# Patient Record
Sex: Male | Born: 2000 | Race: Black or African American | Hispanic: No | Marital: Single | Smoking: Never smoker
Health system: Southern US, Community
[De-identification: ages and names within clinical notes are randomized; demographics above are authoritative.]

## PROBLEM LIST (undated history)

## (undated) DIAGNOSIS — J45909 Unspecified asthma, uncomplicated: Secondary | ICD-10-CM

## (undated) DIAGNOSIS — Z889 Allergy status to unspecified drugs, medicaments and biological substances status: Secondary | ICD-10-CM

---
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
## 2019-01-31 ENCOUNTER — Emergency Department (HOSPITAL_BASED_OUTPATIENT_CLINIC_OR_DEPARTMENT_OTHER)
Admission: EM | Admit: 2019-01-31 | Discharge: 2019-01-31 | Disposition: A | Discharge status: Home / Self Care | Payer: Medicaid Other | Attending: Emergency Medicine | Admitting: Emergency Medicine

## 2019-01-31 ENCOUNTER — Emergency Department (HOSPITAL_BASED_OUTPATIENT_CLINIC_OR_DEPARTMENT_OTHER): Payer: Medicaid Other

## 2019-01-31 ENCOUNTER — Other Ambulatory Visit: Payer: Self-pay

## 2019-01-31 ENCOUNTER — Encounter (HOSPITAL_BASED_OUTPATIENT_CLINIC_OR_DEPARTMENT_OTHER): Payer: Self-pay | Admitting: Emergency Medicine

## 2019-01-31 DIAGNOSIS — R0602 Shortness of breath: Secondary | ICD-10-CM | POA: Diagnosis not present

## 2019-01-31 DIAGNOSIS — H5789 Other specified disorders of eye and adnexa: Secondary | ICD-10-CM | POA: Insufficient documentation

## 2019-01-31 DIAGNOSIS — R410 Disorientation, unspecified: Secondary | ICD-10-CM | POA: Diagnosis not present

## 2019-01-31 DIAGNOSIS — R51 Headache: Secondary | ICD-10-CM | POA: Insufficient documentation

## 2019-01-31 DIAGNOSIS — J45909 Unspecified asthma, uncomplicated: Secondary | ICD-10-CM | POA: Diagnosis not present

## 2019-01-31 DIAGNOSIS — J3489 Other specified disorders of nose and nasal sinuses: Secondary | ICD-10-CM | POA: Diagnosis present

## 2019-01-31 LAB — CBC WITH DIFFERENTIAL/PLATELET
Abs Immature Granulocytes: 0.01 10*3/uL (ref 0.00–0.07)
Basophils Absolute: 0 10*3/uL (ref 0.0–0.1)
Basophils Relative: 0 %
Eosinophils Absolute: 0.5 10*3/uL (ref 0.0–0.5)
Eosinophils Relative: 11 %
HCT: 40.2 % (ref 39.0–52.0)
Hemoglobin: 12 g/dL — ABNORMAL LOW (ref 13.0–17.0)
Immature Granulocytes: 0 %
Lymphocytes Relative: 40 %
Lymphs Abs: 2 10*3/uL (ref 0.7–4.0)
MCH: 26.4 pg (ref 26.0–34.0)
MCHC: 29.9 g/dL — ABNORMAL LOW (ref 30.0–36.0)
MCV: 88.4 fL (ref 80.0–100.0)
Monocytes Absolute: 0.8 10*3/uL (ref 0.1–1.0)
Monocytes Relative: 17 %
Neutro Abs: 1.6 10*3/uL — ABNORMAL LOW (ref 1.7–7.7)
Neutrophils Relative %: 32 %
Platelets: 205 10*3/uL (ref 150–400)
RBC: 4.55 MIL/uL (ref 4.22–5.81)
RDW: 13 % (ref 11.5–15.5)
WBC: 5 10*3/uL (ref 4.0–10.5)
nRBC: 0 % (ref 0.0–0.2)

## 2019-01-31 LAB — URINALYSIS, ROUTINE W REFLEX MICROSCOPIC
Bilirubin Urine: NEGATIVE
Glucose, UA: NEGATIVE mg/dL
Hgb urine dipstick: NEGATIVE
Ketones, ur: NEGATIVE mg/dL
Leukocytes,Ua: NEGATIVE
Nitrite: NEGATIVE
Protein, ur: NEGATIVE mg/dL
Specific Gravity, Urine: 1.02 (ref 1.005–1.030)
pH: 6 (ref 5.0–8.0)

## 2019-01-31 LAB — COMPREHENSIVE METABOLIC PANEL
ALT: 12 U/L (ref 0–44)
AST: 21 U/L (ref 15–41)
Albumin: 4.5 g/dL (ref 3.5–5.0)
Alkaline Phosphatase: 31 U/L — ABNORMAL LOW (ref 38–126)
Anion gap: 8 (ref 5–15)
BUN: 12 mg/dL (ref 6–20)
CO2: 25 mmol/L (ref 22–32)
Calcium: 8.9 mg/dL (ref 8.9–10.3)
Chloride: 105 mmol/L (ref 98–111)
Creatinine, Ser: 0.92 mg/dL (ref 0.61–1.24)
GFR calc Af Amer: >60 mL/min (ref >60)
GFR calc non Af Amer: >60 mL/min (ref >60)
Glucose, Bld: 91 mg/dL (ref 70–99)
Potassium: 4.1 mmol/L (ref 3.5–5.1)
Sodium: 138 mmol/L (ref 135–145)
Total Bilirubin: 1 mg/dL (ref 0.3–1.2)
Total Protein: 7.6 g/dL (ref 6.5–8.1)

## 2019-01-31 NOTE — Discharge Instructions (Addendum)
You may purchase antihistamine such as Claritin, Zyrtec's to help with your runny nose in the morning along with the sinus pressure.  Your laboratory results were within normal limits.  Please follow-up with your primary care physician as needed.

## 2019-01-31 NOTE — ED Provider Notes (Signed)
[facility] EMERGENCY DEPARTMENT Provider Note   CSN: [id number] Arrival date & time: 01/31/19  1148    History   Chief Complaint Chief Complaint  Patient presents with  . Headache    HPI Tracy Mora is a 18 y.o. male.     18 y.o male with no PMH presents to the ED with a chief complaint of headache, shortness of breath and confusion since 2 days. Patient reports he was cleaning his floor with ammonia along with bleach and reports inhaling some of the fumes. He states after this episode he felt his eyes irritated, had a runny nose along with shortness of breath.  He reports noticing that his thought process is not as clear.  States he has a throbbing headache every morning on the frontal part of his head with no radiation.  Reports taking some Tylenol, Advil without improvement in symptoms.  States waking up for the past 2 days with a headache around the same area.   He denies any trauma, vision changes, slurred speech or dizziness.   The history is provided by the patient.  Headache  Associated symptoms: no abdominal pain, no back pain, no cough, no dizziness, no ear pain, no eye pain, no fever, no numbness, no seizures, no sore throat, no vomiting and no weakness     Past Medical History:  Diagnosis Date  . Asthma   . History of seasonal allergies     There are no active problems to display for this patient.   History reviewed. No pertinent surgical history.      Home Medications    Prior to Admission medications   Not on File    Family History No family history on file.  Social History Social History   Tobacco Use  . Smoking status: Never Smoker  . Smokeless tobacco: Never Used  Substance Use Topics  . Alcohol use: Yes    Comment: occ  . Drug use: No     Allergies   Patient has no known allergies.   Review of Systems Review of Systems  Constitutional: Negative for chills and fever.  HENT: Negative for ear pain and sore throat.    Eyes: Negative for pain and visual disturbance.  Respiratory: Positive for shortness of breath. Negative for cough and wheezing.   Cardiovascular: Negative for chest pain and palpitations.  Gastrointestinal: Negative for abdominal pain and vomiting.  Genitourinary: Negative for dysuria and hematuria.  Musculoskeletal: Negative for arthralgias and back pain.  Skin: Negative for color change and rash.  Neurological: Positive for headaches. Negative for dizziness, tremors, seizures, syncope, facial asymmetry, speech difficulty, weakness, light-headedness and numbness.  All other systems reviewed and are negative.    Physical Exam Updated Vital Signs BP 129/77 (BP Location: Right Arm)   Pulse 66   Temp 98.5 F (36.9 C) (Oral)   Resp 16   Ht  (1.803 m)   Wt 74.8 kg   SpO2 99%   BMI 23.01 kg/m   Physical Exam Vitals signs and nursing note reviewed.  Constitutional:      Appearance: He is well-developed.  HENT:     Head: Normocephalic and atraumatic.     Nose:     Right Sinus: Frontal sinus tenderness present. No maxillary sinus tenderness.     Left Sinus: Frontal sinus tenderness present. No maxillary sinus tenderness.  Eyes:     General: No scleral icterus.    Conjunctiva/sclera:     Right eye: Right conjunctiva is  not injected. No chemosis, exudate or hemorrhage.    Left eye: Left conjunctiva is not injected. No chemosis, exudate or hemorrhage.    Pupils: Pupils are equal, round, and reactive to light.     Comments: Pupils are equal and reactive.   Neck:     Musculoskeletal: Normal range of motion.  Cardiovascular:     Heart sounds: Normal heart sounds.  Pulmonary:     Effort: Pulmonary effort is normal.     Breath sounds: Normal breath sounds. No wheezing.     Comments: Lungs are cleared to auscultation. Chest:     Chest wall: No tenderness.  Abdominal:     General: Bowel sounds are normal. There is no distension.     Palpations: Abdomen is soft.      Tenderness: There is no abdominal tenderness.  Musculoskeletal:        General: No tenderness or deformity.  Skin:    General: Skin is warm and dry.  Neurological:     Mental Status: He is alert and oriented to person, place, and time.     Comments: Alert, oriented, thought content appropriate. Speech fluent without evidence of aphasia. Able to follow 2 step commands without difficulty.  Cranial Nerves:  II:  Peripheral visual fields grossly normal, pupils, round, reactive to light III,IV, VI: ptosis not present, extra-ocular motions intact bilaterally  V,VII: smile symmetric, facial light touch sensation equal VIII: hearing grossly normal bilaterally  IX,X: midline uvula rise  XI: bilateral shoulder shrug equal and strong XII: midline tongue extension  Motor:  5/5 in upper and lower extremities bilaterally including strong and equal grip strength and dorsiflexion/plantar flexion Sensory: light touch normal in all extremities.  Cerebellar: normal finger-to-nose with bilateral upper extremities, pronator drift negative Gait: normal gait and balance       ED Treatments / Results  Labs (all labs ordered are listed, but only abnormal results are displayed) Labs Reviewed  CBC WITH DIFFERENTIAL/PLATELET - Abnormal; Notable for the following components:      Result Value   Hemoglobin 12.0 (*)    MCHC 29.9 (*)    Neutro Abs 1.6 (*)    All other components within normal limits  COMPREHENSIVE METABOLIC PANEL - Abnormal; Notable for the following components:   Alkaline Phosphatase 31 (*)    All other components within normal limits  URINALYSIS, ROUTINE W REFLEX MICROSCOPIC    EKG None  Radiology Dg Chest 2 View  Result Date: 01/31/2019 CLINICAL DATA:  Headache, shortness of breath, and confusion. EXAM: CHEST - 2 VIEW COMPARISON:  None. FINDINGS: The heart size and mediastinal contours are within normal limits. Both lungs are clear. The visualized skeletal structures are  unremarkable. IMPRESSION: No active cardiopulmonary disease. Electronically Signed   By: Obie Dredge M.D.   On: 01/31/2019 13:10    Procedures Procedures (including critical care time)  Medications Ordered in ED Medications - No data to display   Initial Impression / Assessment and Plan / ED Course  I have reviewed the triage vital signs and the nursing notes.  Pertinent labs & imaging results that were available during my care of the patient were reviewed by me and considered in my medical decision making (see chart for details).    Patient with no past medical history presents to the ED with complaints of confusion, headache, shortness of breath.  Patient reports he was cleaning his kitchen floor about 2 days ago with ammonium bleach ever since his eyes have started burning along  with a headache, has taken Tylenol for his symptoms without relief.  Reports waking up every morning with a headache, runny nose.  He reports he feels like his train of thought is not as direct.  During evaluation patient is well-appearing, is alert oriented x3.  Neuro exam is unremarkable, does have some pain with palpation of the frontal sinuses.  No slurred speech, facial asymmetry noted during exam.  Will obtain screening labs.    CMP showed no electrolyte normality, glucose is normal at 91.  Creatinine level is normal, LFTs are unremarkable, denies any abdominal pain at this time.  CBC showed no leukocytosis, hemoglobin slightly decreased at 12.0 previous comparison is from 12 years ago.  Urinalysis showed no nitrites, leukocytes, white blood cell count.  Patient's vital signs stable while in the ED, he satting at 99% on room air, no respiratory distress, no wheezing, cough but will obtain a chest x-ray as patient does have a previous history of asthma. DG chest showed:  No consolidation, pneumothorax, pleural effusion.  These results were discussed with patient at length, patient currently on face time with  family member, I advised him he could likely purchase some Claritin or Zyrtec's to help with his rhinorrhea along with sinus pressure in the morning.  Patient is neurologically intact, with stable vital signs satting at 99% on room air without any labored effort.  At this time will have him follow-up with PCP as needed.  Return precautions provided at length.  Portions of this note were generated with Scientist, clinical (histocompatibility and immunogenetics)Dragon dictation software. Dictation errors may occur despite best attempts at proofreading.    Final Clinical Impressions(s) / ED Diagnoses   Final diagnoses:  Sinus pressure    ED Discharge Orders    None       Claude MangesSoto, Sehaj Kolden, PA-C 01/31/19 1334    Terrilee FilesButler, Michael C, MD 01/31/19 [phone]

## 2019-01-31 NOTE — ED Notes (Signed)
ED Provider at bedside. 

## 2019-01-31 NOTE — ED Triage Notes (Signed)
Pt cleaned his kitchen floor 2 days ago with Ammonia and Bleach.  His eyes started burning and he got a HA.  Has had the HA intermittently ever since.  He went to work afterward but sts his thinking was not normal.  He looses his train of thought.  Sts something is not right. This situation continues.

## 2019-02-01 ENCOUNTER — Other Ambulatory Visit: Payer: Self-pay

## 2019-02-01 ENCOUNTER — Emergency Department (HOSPITAL_BASED_OUTPATIENT_CLINIC_OR_DEPARTMENT_OTHER)
Admission: EM | Admit: 2019-02-01 | Discharge: 2019-02-02 | Disposition: A | Discharge status: Home / Self Care | Payer: Medicaid Other | Attending: Emergency Medicine | Admitting: Emergency Medicine

## 2019-02-01 ENCOUNTER — Encounter (HOSPITAL_BASED_OUTPATIENT_CLINIC_OR_DEPARTMENT_OTHER): Payer: Self-pay | Admitting: Emergency Medicine

## 2019-02-01 ENCOUNTER — Emergency Department (HOSPITAL_BASED_OUTPATIENT_CLINIC_OR_DEPARTMENT_OTHER): Payer: Medicaid Other

## 2019-02-01 DIAGNOSIS — J45909 Unspecified asthma, uncomplicated: Secondary | ICD-10-CM | POA: Diagnosis not present

## 2019-02-01 DIAGNOSIS — R51 Headache: Secondary | ICD-10-CM | POA: Insufficient documentation

## 2019-02-01 DIAGNOSIS — R519 Headache, unspecified: Secondary | ICD-10-CM

## 2019-02-01 MED ORDER — DIPHENHYDRAMINE HCL 50 MG/ML IJ SOLN
25.0000 mg | Freq: Once | INTRAMUSCULAR | Status: AC
  Administered 2019-02-01: 23:00:00 25 mg via INTRAVENOUS
  Filled 2019-02-01: qty 1

## 2019-02-01 MED ORDER — SODIUM CHLORIDE 0.9 % IV BOLUS
1000.0000 mL | Freq: Once | INTRAVENOUS | Status: AC
  Administered 2019-02-01: 23:00:00 1000 mL via INTRAVENOUS

## 2019-02-01 MED ORDER — METOCLOPRAMIDE HCL 5 MG/ML IJ SOLN
10.0000 mg | Freq: Once | INTRAMUSCULAR | Status: AC
  Administered 2019-02-01: 23:00:00 10 mg via INTRAVENOUS
  Filled 2019-02-01: qty 2

## 2019-02-01 MED ORDER — ONDANSETRON HCL 4 MG/2ML IJ SOLN
4.0000 mg | Freq: Once | INTRAMUSCULAR | Status: AC
  Administered 2019-02-01: 23:00:00 4 mg via INTRAVENOUS
  Filled 2019-02-01: qty 2

## 2019-02-01 MED ORDER — KETOROLAC TROMETHAMINE 30 MG/ML IJ SOLN
30.0000 mg | Freq: Once | INTRAMUSCULAR | Status: AC
  Administered 2019-02-01: 30 mg via INTRAVENOUS
  Filled 2019-02-01: qty 1

## 2019-02-01 NOTE — ED Triage Notes (Signed)
States took 2 Excedrin today at 1700 then took a nap.

## 2019-02-01 NOTE — ED Triage Notes (Signed)
Patient presents with complaints of headache; states pain behind left eye and slightly behind right eye with intermittent nausea and dizziness; denies vomiting. Seen here yesterday for same; states confusion has resolved since yesterday. Alert and oriented with steady gait.

## 2019-02-02 NOTE — Discharge Instructions (Signed)
Follow-up with [facility] to establish a primary care doctor if you do not have one.   Return emergency department for any worsening headache, fever, difficulty walking, numbness/weakness in arms or legs or any other worsening or concerning symptoms.

## 2019-02-02 NOTE — ED Notes (Signed)
Pt passed PO challenge.

## 2019-02-02 NOTE — ED Provider Notes (Signed)
[facility] EMERGENCY DEPARTMENT Provider Note   CSN: [id number] Arrival date & time: 02/01/19  2122    History   Chief Complaint Chief Complaint  Patient presents with  . Headache    HPI Tracy Mora is a 18 y.o. male who presents for evaluation of headache that began today.  Patient reports that he started developing headache earlier this morning.  He states that headache began gradual in nature and progressively worsened.  No thunderclap headache.  No preceding trauma, injury, fall.  He denies any alleviating or aggravating factors.  He reports some associated photophobia as well as nausea, dizziness.  He has not had any vomiting.  Patient states he does not have any history of migraines.  He was seen here in the emergency department yesterday for evaluation for similar symptoms as well as some concern for inhaled ammonia and bleach.  Patient reports that he felt better at that time but then states today, he had repeat headache, prompting ED visit.  He did take Excedrin at 5:00 but states that did not help with his pain.  Patient denies any fever, chest pain, difficulty breathing, changes, numbness/weakness of his arms or legs, difficulty ambulating.     The history is provided by the patient.    Past Medical History:  Diagnosis Date  . Asthma   . History of seasonal allergies     There are no active problems to display for this patient.   History reviewed. No pertinent surgical history.      Home Medications    Prior to Admission medications   Not on File    Family History History reviewed. No pertinent family history.  Social History Social History   Tobacco Use  . Smoking status: Never Smoker  . Smokeless tobacco: Never Used  Substance Use Topics  . Alcohol use: Yes    Comment: occ  . Drug use: No     Allergies   Patient has no known allergies.   Review of Systems Review of Systems  Constitutional: Negative for fever.  Eyes: Positive  for photophobia. Negative for visual disturbance.  Respiratory: Negative for cough and shortness of breath.   Cardiovascular: Negative for chest pain.  Gastrointestinal: Positive for nausea. Negative for abdominal pain and vomiting.  Genitourinary: Negative for dysuria and hematuria.  Neurological: Positive for headaches. Negative for weakness and numbness.  All other systems reviewed and are negative.    Physical Exam Updated Vital Signs BP 117/63   Pulse 69   Temp 98.5 F (36.9 C) (Oral)   Resp 16   Ht 5\' 11"  (1.803 m)   Wt 74.8 kg   SpO2 100%   BMI 23.01 kg/m   Physical Exam Vitals signs and nursing note reviewed.  Constitutional:      Appearance: Normal appearance. He is well-developed.  HENT:     Head: Normocephalic and atraumatic.  Eyes:     General: Lids are normal.     Conjunctiva/sclera: Conjunctivae normal.     Pupils: Pupils are equal, round, and reactive to light.     Comments: PERRL. EOMS intact without any difficulty.   Neck:     Musculoskeletal: Full passive range of motion without pain and neck supple.     Comments: Full flexion/extension and lateral movement of neck fully intact. No bony midline tenderness. No deformities or crepitus.  No rigidity.  Neck is supple. Cardiovascular:     Rate and Rhythm: Normal rate and regular rhythm.  Pulses: Normal pulses.     Heart sounds: Normal heart sounds. No murmur. No friction rub. No gallop.   Pulmonary:     Effort: Pulmonary effort is normal.     Breath sounds: Normal breath sounds.  Abdominal:     Palpations: Abdomen is soft. Abdomen is not rigid.     Tenderness: There is no abdominal tenderness. There is no guarding.  Musculoskeletal: Normal range of motion.  Skin:    General: Skin is warm and dry.     Capillary Refill: Capillary refill takes less than 2 seconds.  Neurological:     Mental Status: He is alert and oriented to person, place, and time.     Comments: Cranial nerves III-XII intact Follows  commands, Moves all extremities  5/5 strength to BUE and BLE  Sensation intact throughout all major nerve distributions Normal coordination No pronator drift. No gait abnormalities  No slurred speech. No facial droop.   Psychiatric:        Speech: Speech normal.      ED Treatments / Results  Labs (all labs ordered are listed, but only abnormal results are displayed) Labs Reviewed - No data to display  EKG None  Radiology Ct Head Wo Contrast  Result Date: 02/01/2019 CLINICAL DATA:  Headache for 2 days since using cleaning products. Initial encounter. EXAM: CT HEAD WITHOUT CONTRAST TECHNIQUE: Contiguous axial images were obtained from the base of the skull through the vertex without intravenous contrast. COMPARISON:  None. FINDINGS: Brain: No evidence of acute infarction, hemorrhage, hydrocephalus, extra-axial collection or mass lesion/mass effect. Vascular: No hyperdense vessel or unexpected calcification. Skull: Normal. Negative for fracture or focal lesion. Sinuses/Orbits: Negative. Other: None. IMPRESSION: Normal head CT. Electronically Signed   By: Drusilla Kanner M.D.   On: 02/01/2019 23:22    Procedures Procedures (including critical care time)  Medications Ordered in ED Medications  ondansetron (ZOFRAN) injection 4 mg (4 mg Intravenous Given 02/01/19 2242)  metoCLOPramide (REGLAN) injection 10 mg (10 mg Intravenous Given 02/01/19 2243)  sodium chloride 0.9 % bolus 1,000 mL ( Intravenous Stopped 02/01/19 2343)  diphenhydrAMINE (BENADRYL) injection 25 mg (25 mg Intravenous Given 02/01/19 2241)  ketorolac (TORADOL) 30 MG/ML injection 30 mg (30 mg Intravenous Given 02/01/19 2333)     Initial Impression / Assessment and Plan / ED Course  I have reviewed the triage vital signs and the nursing notes.  Pertinent labs & imaging results that were available during my care of the patient were reviewed by me and considered in my medical decision making (see chart for details).         18 year old male who presents for evaluation of headache that began yesterday.  States he was seen in the emergency department yesterday felt better and reports headache returned today.  No preceding trauma, injury, fall.  He states he is concerned because he was cleaning with ammonia and bleach.  Reports some associated photophobia, nausea, dizziness.  No fevers, vision changes, numbness/weakness of arms or legs. Patient is afebrile, non-toxic appearing, sitting comfortably on examination table. Vital signs reviewed and stable. No neuro deficits noted on exam.  Consider migraine headache versus sinusitis. Given that he was here yesterday, will review his visit.  History/physical exam not concerning for meningitis, dural venous thrombosis.  Review of visit yesterday show no evidence of head CT.  Given that his second visit, will plan for head CT for evaluation of any intracranial abnormality.  ED had reviewed.  No evidence of intracranial abnormality.  Reevaluation  after migraine cocktail.  Patient reports improvement in headache.  We will plan to p.o. challenge.  Patient able tolerate p.o. without difficulty.  Vital signs are stable.  He is able to ambulate in the part without any abnormalities.  Patient reports improvement in his headache. At this time, patient exhibits no emergent life-threatening condition that require further evaluation in ED or admission. Patient had ample opportunity for questions and discussion. All patient's questions were answered with full understanding. Strict return precautions discussed. Patient expresses understanding and agreement to plan.   Portions of this note were generated with Scientist, clinical (histocompatibility and immunogenetics). Dictation errors may occur despite best attempts at proofreading.   Final Clinical Impressions(s) / ED Diagnoses   Final diagnoses:  Generalized headache    ED Discharge Orders    None       Rosana Hoes 02/02/19 2312    Little,  Ambrose Finland, MD 02/03/19 [phone]

## 2019-02-02 NOTE — ED Notes (Signed)
Pt understood dc material. NAD noted. All questions answered to satisfaction. Pt escorted to check out counter. 

## 2019-02-02 NOTE — ED Notes (Signed)
ED Provider at bedside. 

## 2019-02-13 NOTE — Progress Notes (Signed)
Patient ID: Tracy Mora, male   DOB: 2001/03/07, 18 y.o.   MRN: [id number]   Virtual Visit via Telephone Note  I connected with Tracy Mora on 02/14/19 at  1:30 PM EDT by telephone and verified that I am speaking with the correct person using two identifiers.   I discussed the limitations, risks, security and privacy concerns of performing an evaluation and management service by telephone and the availability of in person appointments. I also discussed with the patient that there may be a patient responsible charge related to this service. The patient expressed understanding and agreed to proceed.   Patient location:  home My Location:  [facility] office Persons on the call:  The patient and myself  History of Present Illness:  After being seen in the ED 02/01/2019 for HA after mixing bleach and ammonia.  Head CT was negative.  No HA since.  HA have gone away.    C/o infected razor bump in his genital region.  Just shaved yesterday and now looks like a pimple.  No fever.    Tracy Mora is a 18 y.o. male who presents for evaluation of headache that began today.  Patient reports that he started developing headache earlier this morning.  He states that headache began gradual in nature and progressively worsened.  No thunderclap headache.  No preceding trauma, injury, fall.  He denies any alleviating or aggravating factors.  He reports some associated photophobia as well as nausea, dizziness.  He has not had any vomiting.  Patient states he does not have any history of migraines.  He was seen here in the emergency department yesterday for evaluation for similar symptoms as well as some concern for inhaled ammonia and bleach.  Patient reports that he felt better at that time but then states today, he had repeat headache, prompting ED visit.  He did take Excedrin at 5:00 but states that did not help with his pain.  Patient denies any fever, chest pain, difficulty breathing, changes, numbness/weakness  of his arms or legs, difficulty ambulating.  From ED 02/01/2019 ED had reviewed.  No evidence of intracranial abnormality.  Reevaluation after migraine cocktail.  Patient reports improvement in headache.  We will plan to p.o. challenge.  Patient able tolerate p.o. without difficulty.  Vital signs are stable.  He is able to ambulate in the part without any abnormalities.  Patient reports improvement in his headache. At this time, patient exhibits no emergent life-threatening condition that require further evaluation in ED or admission.   Observations/Objective: A&Ox3 TP linear.  Speech is clear   Assessment and Plan: 1. Acute nonintractable headache, unspecified headache type resolved  2. Folliculitis - mupirocin ointment (BACTROBAN) 2 %; Apply small amount bid to AA  Dispense: 22 g; Refill: 0  3. Encounter for examination following treatment at hospital Resolved/no further episode    Follow Up Instructions: 2-3 months to assign pcp   I discussed the assessment and treatment plan with the patient. The patient was provided an opportunity to ask questions and all were answered. The patient agreed with the plan and demonstrated an understanding of the instructions.   The patient was advised to call back or seek an in-person evaluation if the symptoms worsen or if the condition fails to improve as anticipated.  I provided 8 minutes of non-face-to-face time during this encounter.   Georgian Co, PA-C

## 2019-02-14 ENCOUNTER — Other Ambulatory Visit: Payer: Self-pay

## 2019-02-14 ENCOUNTER — Ambulatory Visit: Payer: Medicaid Other | Attending: Family Medicine | Admitting: Physician Assistant

## 2019-02-14 DIAGNOSIS — L739 Follicular disorder, unspecified: Secondary | ICD-10-CM

## 2019-02-14 DIAGNOSIS — R51 Headache: Secondary | ICD-10-CM

## 2019-02-14 DIAGNOSIS — Z09 Encounter for follow-up examination after completed treatment for conditions other than malignant neoplasm: Secondary | ICD-10-CM

## 2019-02-14 DIAGNOSIS — R519 Headache, unspecified: Secondary | ICD-10-CM

## 2019-02-14 MED ORDER — MUPIROCIN 2 % EX OINT: TOPICAL_OINTMENT | CUTANEOUS | 0 refills | Status: DC

## 2019-02-14 NOTE — Progress Notes (Signed)
Pt. Stated his headache got much better and think it may be due to stress.

## 2019-05-09 ENCOUNTER — Emergency Department (HOSPITAL_BASED_OUTPATIENT_CLINIC_OR_DEPARTMENT_OTHER)
Admission: EM | Admit: 2019-05-09 | Discharge: 2019-05-09 | Disposition: A | Discharge status: Home / Self Care | Payer: Medicaid Other | Attending: Emergency Medicine | Admitting: Emergency Medicine

## 2019-05-09 ENCOUNTER — Other Ambulatory Visit: Payer: Self-pay

## 2019-05-09 ENCOUNTER — Encounter (HOSPITAL_BASED_OUTPATIENT_CLINIC_OR_DEPARTMENT_OTHER): Payer: Self-pay | Admitting: *Deleted

## 2019-05-09 DIAGNOSIS — R220 Localized swelling, mass and lump, head: Secondary | ICD-10-CM | POA: Diagnosis not present

## 2019-05-09 DIAGNOSIS — K130 Diseases of lips: Secondary | ICD-10-CM | POA: Diagnosis present

## 2019-05-09 DIAGNOSIS — Q380 Congenital malformations of lips, not elsewhere classified: Secondary | ICD-10-CM

## 2019-05-09 NOTE — ED Provider Notes (Signed)
[facility] Emergency Department Provider Note MRN:  [id number]  Arrival date & time: 05/09/19     Chief Complaint   Oral Swelling   History of Present Illness   Tracy Mora is a 17 y.o. year-old male with a history of asthma presenting to the ED with chief complaint of nodule.  Patient woke up with a small nodule to the middle part of his upper lip.  Denies any other complaints, no shortness of breath, no sore throat, no fever, no abdominal pain, no allergies, no new detergents or exposures, no bug bites.  Review of Systems  A problem-focused ROS was performed. Positive for nodule.  Patient denies fever, shortness of breath. Patient's Health History    Past Medical History:  Diagnosis Date  . Asthma   . History of seasonal allergies     History reviewed. No pertinent surgical history.  History reviewed. No pertinent family history.  Social History   Socioeconomic History  . Marital status: Single    Spouse name: Not on file  . Number of children: Not on file  . Years of education: Not on file  . Highest education level: Not on file  Occupational History  . Not on file  Social Needs  . Financial resource strain: Not on file  . Food insecurity    Worry: Not on file    Inability: Not on file  . Transportation needs    Medical: Not on file    Non-medical: Not on file  Tobacco Use  . Smoking status: Never Smoker  . Smokeless tobacco: Never Used  Substance and Sexual Activity  . Alcohol use: Yes    Comment: occ  . Drug use: No  . Sexual activity: Yes    Birth control/protection: None  Lifestyle  . Physical activity    Days per week: Not on file    Minutes per session: Not on file  . Stress: Not on file  Relationships  . Social Herbalist on phone: Not on file    Gets together: Not on file    Attends religious service: Not on file    Active member of club or organization: Not on file    Attends meetings of clubs or  organizations: Not on file    Relationship status: Not on file  . Intimate partner violence    Fear of current or ex partner: Not on file    Emotionally abused: Not on file    Physically abused: Not on file    Forced sexual activity: Not on file  Other Topics Concern  . Not on file  Social History Narrative  . Not on file     Physical Exam  Vital Signs and Nursing Notes reviewed Vitals:   05/09/19 1138  BP: 120/67  Pulse: 64  Resp: 12  Temp: 98.3 F (36.8 C)  SpO2: 100%    CONSTITUTIONAL: Well-appearing, NAD NEURO:  Alert and oriented x 3, no focal deficits EYES:  eyes equal and reactive ENT/NECK:  no LAD, no JVD CARDIO: Regular rate, well-perfused, normal S1 and S2 PULM:  CTAB no wheezing or rhonchi GI/GU:  normal bowel sounds, non-distended, non-tender MSK/SPINE:  No gross deformities, no edema SKIN:  no rash; there is a 0.5 cm soft nodule to the medial aspect of the upper lip below the vermilion border [facility]:  Appropriate speech and behavior  Diagnostic and Interventional Summary    Labs Reviewed - No data to display  No  orders to display    Medications - No data to display   Procedures Critical Care  ED Course and Medical Decision Making  I have reviewed the triage vital signs and the nursing notes.  Pertinent labs & imaging results that were available during my care of the patient were reviewed by me and considered in my medical decision making (see below for details).  Nodule to the upper lip, no signs of infection, no other symptoms to suggest significant allergic reaction, normal vital signs, no increased work of breathing, considering bug bite, advised Benadryl at home, advise dermatology follow-up if not improved.  Tracy SowMichael M. Pilar PlateBero, MD [email or web address] [email or web address]  Final Clinical Impressions(s) / ED Diagnoses     ICD-10-CM   1. Median nodule of upper lip  Q38.0     ED Discharge Orders    None         Discharge Instructions     You were evaluated in the Emergency Department and after careful evaluation, we did not find any emergent condition requiring admission or further testing in the hospital.  We recommend Benadryl cream for the next few days.  If not improved or resolved within a week, we recommend follow-up with a dermatologist.  Please return to the Emergency Department if you experience any worsening of your condition.  We encourage you to follow up with a primary care provider.  Thank you for allowing us to be a part of your care.      Sabas SousBero, Tracy M, MD 05/09/19 1316

## 2019-05-09 NOTE — Discharge Instructions (Signed)
You were evaluated in the Emergency Department and after careful evaluation, we did not find any emergent condition requiring admission or further testing in the hospital.  We recommend Benadryl cream for the next few days.  If not improved or resolved within a week, we recommend follow-up with a dermatologist.  Please return to the Emergency Department if you experience any worsening of your condition.  We encourage you to follow up with a primary care provider.  Thank you for allowing [place] to be a part of your care.

## 2019-05-09 NOTE — ED Triage Notes (Signed)
Pt reports noticing swelling to the cupids bow area of his upper lip this am. No redness, tenderness, denies pain or itching, denies any injury or trauma.

## 2019-06-06 ENCOUNTER — Ambulatory Visit: Payer: Medicaid Other | Admitting: Family Medicine

## 2019-07-08 ENCOUNTER — Telehealth: Payer: Medicaid Other

## 2019-07-10 ENCOUNTER — Ambulatory Visit: Payer: Medicaid Other | Attending: Family Medicine | Admitting: Physician Assistant

## 2019-07-10 ENCOUNTER — Other Ambulatory Visit: Payer: Self-pay

## 2019-07-10 DIAGNOSIS — R519 Headache, unspecified: Secondary | ICD-10-CM

## 2019-07-10 DIAGNOSIS — R509 Fever, unspecified: Secondary | ICD-10-CM

## 2019-07-10 DIAGNOSIS — L738 Other specified follicular disorders: Secondary | ICD-10-CM | POA: Diagnosis not present

## 2019-07-10 DIAGNOSIS — L739 Follicular disorder, unspecified: Secondary | ICD-10-CM

## 2019-07-10 MED ORDER — MUPIROCIN 2 % EX OINT: TOPICAL_OINTMENT | CUTANEOUS | 0 refills | Status: DC

## 2019-07-10 NOTE — Progress Notes (Signed)
Patient ID: Tracy Mora, male   DOB: 09-16-2001, 18 y.o.   MRN: [id number] Virtual Visit via Telephone Note  I connected with Tracy Mora on 07/10/19 at 10:10 AM EDT by telephone and verified that I am speaking with the correct person using two identifiers.   I discussed the limitations, risks, security and privacy concerns of performing an evaluation and management service by telephone and the availability of in person appointments. I also discussed with the patient that there may be a patient responsible charge related to this service. The patient expressed understanding and agreed to proceed.  Patient location:  home My Location:  home office Persons on the call:  Me and the patient.   History of Present Illness:  Patient c/o 1 week h/o HA and fatigue. HA is all over.  No vision changes. Unsure if he has been exposed to coronavirus.  Last night he had a temp of 100. No N/V/D.  No cough or SOB.  Also trouble sleeping for about 1 week-wakes up 3-4 times a night.  excedrin helped his HA.  + chills.    Needs RF on bactroban for occasional folliculitis.     Observations/Objective:  NAD.  Speech is clear.  A&Ox3   Assessment and Plan: 1. Folliculitis - mupirocin ointment (BACTROBAN) 2 %; Apply small amount bid to AA  Dispense: 22 g; Refill: 0  2. Nonintractable headache, unspecified chronicity pattern, unspecified headache type Tylenol or advil - Novel Coronavirus, NAA ([facility]); Future  3. Fever, unspecified fever cause Covid precautions discussed - Novel Coronavirus, NAA ([facility]); Future   Follow Up Instructions: 4-6 weeks to be assigned PCP   I discussed the assessment and treatment plan with the patient. The patient was provided an opportunity to ask questions and all were answered. The patient agreed with the plan and demonstrated an understanding of the instructions.   The patient was advised to call back or seek an in-person evaluation if the symptoms worsen or if the  condition fails to improve as anticipated.  I provided 11 minutes of non-face-to-face time during this encounter.   Freeman Caldron, PA-C

## 2019-07-10 NOTE — Progress Notes (Signed)
Med refill  Fatigue for about 1 wk now  Headache for about 1 wk but have a history of Migraines that started April but went away on it's own.   Fever last night at 100.00 but this morning at 98  Per pt when it comes to his chest pain, he have a history of anxiety and when the pain does come its off and on and not consistent.

## 2019-07-11 ENCOUNTER — Emergency Department (HOSPITAL_BASED_OUTPATIENT_CLINIC_OR_DEPARTMENT_OTHER)
Admission: EM | Admit: 2019-07-11 | Discharge: 2019-07-11 | Disposition: A | Discharge status: Home / Self Care | Payer: Medicaid Other | Attending: Emergency Medicine | Admitting: Emergency Medicine

## 2019-07-11 ENCOUNTER — Emergency Department (HOSPITAL_BASED_OUTPATIENT_CLINIC_OR_DEPARTMENT_OTHER): Payer: Medicaid Other

## 2019-07-11 ENCOUNTER — Other Ambulatory Visit: Payer: Self-pay

## 2019-07-11 ENCOUNTER — Encounter (HOSPITAL_BASED_OUTPATIENT_CLINIC_OR_DEPARTMENT_OTHER): Payer: Self-pay | Admitting: *Deleted

## 2019-07-11 DIAGNOSIS — J45909 Unspecified asthma, uncomplicated: Secondary | ICD-10-CM | POA: Insufficient documentation

## 2019-07-11 DIAGNOSIS — R0789 Other chest pain: Secondary | ICD-10-CM | POA: Diagnosis not present

## 2019-07-11 DIAGNOSIS — R071 Chest pain on breathing: Secondary | ICD-10-CM | POA: Insufficient documentation

## 2019-07-11 DIAGNOSIS — R079 Chest pain, unspecified: Secondary | ICD-10-CM | POA: Diagnosis present

## 2019-07-11 MED ORDER — CYCLOBENZAPRINE HCL 10 MG PO TABS: 10.0000 mg | ORAL_TABLET | Freq: Three times a day (TID) | ORAL | 0 refills | Status: DC | PRN

## 2019-07-11 MED ORDER — IBUPROFEN 800 MG PO TABS: 800.0000 mg | ORAL_TABLET | Freq: Three times a day (TID) | ORAL | 0 refills | Status: DC | PRN

## 2019-07-11 NOTE — ED Notes (Signed)
ED Provider at bedside. 

## 2019-07-11 NOTE — ED Triage Notes (Addendum)
C/o left sided chest pain and left arm numbness  " only at night "  Increased pain with movt x 5 days

## 2019-07-11 NOTE — Discharge Instructions (Addendum)
Return here as needed. Follow up with a primary doctor. °

## 2019-07-11 NOTE — ED Notes (Signed)
Family at bedside. 

## 2019-07-15 NOTE — ED Provider Notes (Signed)
[facility] EMERGENCY DEPARTMENT Provider Note   CSN: [id number] Arrival date & time: 07/11/19  1853     History   Chief Complaint Chief Complaint  Patient presents with  . Chest Pain    HPI Tracy Mora is a 18 y.o. male.     HPI Patient presents to the emergency department with left-sided chest pain only at night.  The patient states the pain increases with movement.  Patient states has been ongoing for the last 5 days.  Patient states that certain movements and palpation make the pain worse.  Patient states deep breathing also makes the pain worse.  Patient denies any history of early onset cardiac disease in his family.  The patient denies shortness of breath, headache,blurred vision, neck pain, fever, cough, weakness, numbness, dizziness, anorexia, edema, abdominal pain, nausea, vomiting, diarrhea, rash, back pain, dysuria, hematemesis, bloody stool, near syncope, or syncope. Past Medical History:  Diagnosis Date  . Asthma   . History of seasonal allergies     There are no active problems to display for this patient.   History reviewed. No pertinent surgical history.      Home Medications    Prior to Admission medications   Medication Sig Start Date End Date Taking? Authorizing Provider  cyclobenzaprine (FLEXERIL) 10 MG tablet Take 1 tablet (10 mg total) by mouth 3 (three) times daily as needed for muscle spasms. 07/11/19   Mikaele Stecher, Harrell Gave, PA-C  ibuprofen (ADVIL) 800 MG tablet Take 1 tablet (800 mg total) by mouth every 8 (eight) hours as needed. 07/11/19   Dewanda Fennema, Harrell Gave, PA-C  mupirocin ointment (BACTROBAN) 2 % Apply small amount bid to AA 07/10/19   Argentina Donovan, PA-C    Family History No family history on file.  Social History Social History   Tobacco Use  . Smoking status: Never Smoker  . Smokeless tobacco: Never Used  Substance Use Topics  . Alcohol use: Yes    Comment: occ  . Drug use: No     Allergies   Patient has  no known allergies.   Review of Systems Review of Systems All other systems negative except as documented in the HPI. All pertinent positives and negatives as reviewed in the HPI.  Physical Exam Updated Vital Signs BP 111/64 (BP Location: Right Arm)   Pulse 70   Temp 98.4 F (36.9 C)   Resp 18   Ht 5\' 11"  (1.803 m)   Wt 77.1 kg   SpO2 97%   BMI 23.71 kg/m   Physical Exam Vitals signs and nursing note reviewed.  Constitutional:      General: He is not in acute distress.    Appearance: He is well-developed.  HENT:     Head: Normocephalic and atraumatic.  Eyes:     Pupils: Pupils are equal, round, and reactive to light.  Neck:     Musculoskeletal: Normal range of motion and neck supple.  Cardiovascular:     Rate and Rhythm: Normal rate and regular rhythm.     Heart sounds: Normal heart sounds. No murmur. No friction rub. No gallop.   Pulmonary:     Effort: Pulmonary effort is normal. No respiratory distress.     Breath sounds: Normal breath sounds. No decreased breath sounds or wheezing.  Abdominal:     General: Bowel sounds are normal. There is no distension.     Palpations: Abdomen is soft.     Tenderness: There is no abdominal tenderness.  Skin:  General: Skin is warm and dry.     Capillary Refill: Capillary refill takes less than 2 seconds.     Findings: No erythema or rash.  Neurological:     Mental Status: He is alert and oriented to person, place, and time.     Motor: No abnormal muscle tone.     Coordination: Coordination normal.  Psychiatric:        Behavior: Behavior normal.      ED Treatments / Results  Labs (all labs ordered are listed, but only abnormal results are displayed) Labs Reviewed - No data to display  EKG EKG Interpretation  Date/Time:  Thursday July 11 2019 18:59:39 EDT Ventricular Rate:  88 PR Interval:  140 QRS Duration: 92 QT Interval:  318 QTC Calculation: 384 R Axis:   41 Text Interpretation:  Normal sinus rhythm  Normal ECG Confirmed by Raeford Razor [phone]) on 07/11/2019 8:16:27 PM   Radiology No results found.  Procedures Procedures (including critical care time)  Medications Ordered in ED Medications - No data to display   Initial Impression / Assessment and Plan / ED Course  I have reviewed the triage vital signs and the nursing notes.  Pertinent labs & imaging results that were available during my care of the patient were reviewed by me and considered in my medical decision making (see chart for details).       Patient will be discharged with diagnosis of chest wall pain.  Patient states that he has no questions and agrees to the plan.  I have advised patient return here for any worsening in his condition.  The EKG did not show any acute changes and his chest x-ray does not show any abnormalities.  His vital signs remained stable here in the emergency department.  Final Clinical Impressions(s) / ED Diagnoses   Final diagnoses:  Chest wall pain    ED Discharge Orders         Ordered    ibuprofen (ADVIL) 800 MG tablet  Every 8 hours PRN     07/11/19 2105    cyclobenzaprine (FLEXERIL) 10 MG tablet  3 times daily PRN     07/11/19 2105           Charlestine Night, PA-C 07/15/19 Ronnette Hila, MD 07/15/19 1040

## 2019-12-20 ENCOUNTER — Encounter (HOSPITAL_BASED_OUTPATIENT_CLINIC_OR_DEPARTMENT_OTHER): Payer: Self-pay | Admitting: *Deleted

## 2019-12-20 ENCOUNTER — Emergency Department (HOSPITAL_BASED_OUTPATIENT_CLINIC_OR_DEPARTMENT_OTHER): Payer: Medicaid Other

## 2019-12-20 ENCOUNTER — Emergency Department (HOSPITAL_BASED_OUTPATIENT_CLINIC_OR_DEPARTMENT_OTHER)
Admission: EM | Admit: 2019-12-20 | Discharge: 2019-12-20 | Disposition: A | Discharge status: Home / Self Care | Payer: Medicaid Other | Attending: Emergency Medicine | Admitting: Emergency Medicine

## 2019-12-20 ENCOUNTER — Other Ambulatory Visit: Payer: Self-pay

## 2019-12-20 DIAGNOSIS — R0789 Other chest pain: Secondary | ICD-10-CM | POA: Insufficient documentation

## 2019-12-20 DIAGNOSIS — J45909 Unspecified asthma, uncomplicated: Secondary | ICD-10-CM | POA: Insufficient documentation

## 2019-12-20 DIAGNOSIS — R531 Weakness: Secondary | ICD-10-CM | POA: Insufficient documentation

## 2019-12-20 DIAGNOSIS — R1310 Dysphagia, unspecified: Secondary | ICD-10-CM | POA: Diagnosis not present

## 2019-12-20 MED ORDER — MELOXICAM 15 MG PO TABS: ORAL_TABLET | ORAL | 0 refills | Status: AC

## 2019-12-20 MED ORDER — PANTOPRAZOLE SODIUM 40 MG PO TBEC
40.0000 mg | DELAYED_RELEASE_TABLET | Freq: Once | ORAL | Status: AC
  Administered 2019-12-20: 40 mg via ORAL
  Filled 2019-12-20: qty 1

## 2019-12-20 MED ORDER — ESOMEPRAZOLE MAGNESIUM 40 MG PO CPDR: 40.0000 mg | DELAYED_RELEASE_CAPSULE | Freq: Every day | ORAL | 0 refills | Status: AC

## 2019-12-20 NOTE — ED Triage Notes (Signed)
States he was [place] the beach last [place] and was playing roughly with his brother. He feels like he pulled a muscle in his left chest. He woke this am with weakness and pain in his left arm.

## 2019-12-20 NOTE — ED Provider Notes (Signed)
[place] DEPT [facility] Provider Note: Georgena Spurling, MD, FACEP  CSN: [id number] MRN: [id number] ARRIVAL: 12/20/19 at 2216 ROOM: [place]  Chest Pain   HISTORY OF PRESENT ILLNESS  12/20/19 11:17 PM Tracy Mora is a 19 y.o. male who was playing roughly with his brother several days ago and feels like he pulled a muscle in his left chest.  Specifically he has been having pain in his left chest about the anterior axillary line inferior to the axilla.  He rates his pain as a 5 out of 10.  It only occurs with certain positions.  Notably it occurs when he turns his head to the left.  It is not worse with breathing nor is he short of breath.  He has taken ibuprofen without relief.  He states when he wakes up in the morning he sometimes has a sensation of weakness in his left arm.  He also has been having some discomfort when swallowing food but not liquids.  He describes this as a sensation that something may be getting stuck.  He denies a history of acid reflux.   Past Medical History:  Diagnosis Date  . Asthma   . History of seasonal allergies     History reviewed. No pertinent surgical history.  No family history on file.  Social History   Tobacco Use  . Smoking status: Never Smoker  . Smokeless tobacco: Never Used  Substance Use Topics  . Alcohol use: Yes    Comment: occ  . Drug use: No    Prior to Admission medications   Medication Sig Start Date End Date Taking? Authorizing Provider  esomeprazole (NEXIUM) 40 MG capsule Take 1 capsule (40 mg total) by mouth daily. 12/20/19   Shefali Ng, MD  meloxicam (MOBIC) 15 MG tablet Take 1 tablet daily as needed for pain. 12/20/19   Vinia Jemmott, Jenny Reichmann, MD    Allergies Patient has no known allergies.   REVIEW OF SYSTEMS  Negative except as noted here or in the History of Present Illness.   PHYSICAL EXAMINATION  Initial Vital Signs Blood pressure 109/70, pulse 86, temperature 98.2 F (36.8 C), temperature source  Oral, resp. rate 16, height 5\' 11"  (1.803 m), weight 90.7 kg, SpO2 97 %.  Examination General: Well-developed, well-nourished male in no acute distress; appearance consistent with age of record HENT: normocephalic; atraumatic Eyes: pupils equal, round and reactive to light; extraocular muscles intact Neck: supple; rotation of the head to the right reproduces chest pain Heart: regular rate and rhythm Lungs: clear to auscultation bilaterally Chest: Nontender Abdomen: soft; nondistended; nontender; bowel sounds present Extremities: No deformity; full range of motion; pulses normal Neurologic: Awake, alert and oriented; motor function intact in all extremities and symmetric; no facial droop Skin: Warm and dry Psychiatric: Normal mood and affect   RESULTS  Summary of this visit's results, reviewed and interpreted by myself:   EKG Interpretation  Date/Time:    Ventricular Rate:    PR Interval:    QRS Duration:   QT Interval:    QTC Calculation:   R Axis:     Text Interpretation:        Laboratory Studies: No results found for this or any previous visit (from the past 24 hour(s)). Imaging Studies: DG Chest 2 View  Result Date: 12/20/2019 CLINICAL DATA:  Chest pain EXAM: CHEST - 2 VIEW COMPARISON:  07/11/2019 FINDINGS: The heart size and mediastinal contours are within normal limits. Both lungs are clear. The visualized  skeletal structures are unremarkable. IMPRESSION: No active cardiopulmonary disease. Electronically Signed   By: Jasmine Pang M.D.   On: 12/20/2019 23:02    ED COURSE and MDM  Nursing notes, initial and subsequent vitals signs, including pulse oximetry, reviewed and interpreted by myself.  Vitals:   12/20/19 2222 12/20/19 2225  BP:  109/70  Pulse:  86  Resp:  16  Temp:  98.2 F (36.8 C)  TempSrc:  Oral  SpO2:  97%  Weight: 90.7 kg   Height: 5\' 11"  (1.803 m)    Medications  pantoprazole (PROTONIX) EC tablet 40 mg (has no administration in time range)     The patient's pain is difficult to reproduce but does manifest itself with rotation of his neck.  This could represent a cervical radiculopathy although the location of his pain is somewhat atypical.  We will trial him on a short course of Mobic which may be easier on his stomach then ibuprofen or naproxen.  We will also place him on a PPI.  PROCEDURES  Procedures   ED DIAGNOSES     ICD-10-CM   1. Chest wall pain  R07.89   2. Difficulty swallowing solids  R13.10        Saif Peter, , MD 12/20/19 2338

## 2020-07-10 ENCOUNTER — Encounter (HOSPITAL_BASED_OUTPATIENT_CLINIC_OR_DEPARTMENT_OTHER): Payer: Self-pay | Admitting: *Deleted

## 2020-07-10 ENCOUNTER — Other Ambulatory Visit: Payer: Self-pay

## 2020-07-10 ENCOUNTER — Emergency Department (HOSPITAL_BASED_OUTPATIENT_CLINIC_OR_DEPARTMENT_OTHER)
Admission: EM | Admit: 2020-07-10 | Discharge: 2020-07-10 | Disposition: A | Discharge status: Home / Self Care | Payer: Medicaid Other | Attending: Emergency Medicine | Admitting: Emergency Medicine

## 2020-07-10 ENCOUNTER — Emergency Department (HOSPITAL_BASED_OUTPATIENT_CLINIC_OR_DEPARTMENT_OTHER): Payer: Medicaid Other

## 2020-07-10 DIAGNOSIS — N342 Other urethritis: Secondary | ICD-10-CM | POA: Diagnosis not present

## 2020-07-10 DIAGNOSIS — S40212A Abrasion of left shoulder, initial encounter: Secondary | ICD-10-CM | POA: Diagnosis present

## 2020-07-10 DIAGNOSIS — S81012A Laceration without foreign body, left knee, initial encounter: Secondary | ICD-10-CM | POA: Diagnosis not present

## 2020-07-10 DIAGNOSIS — S43402A Unspecified sprain of left shoulder joint, initial encounter: Secondary | ICD-10-CM

## 2020-07-10 DIAGNOSIS — S80211A Abrasion, right knee, initial encounter: Secondary | ICD-10-CM | POA: Diagnosis not present

## 2020-07-10 DIAGNOSIS — Y92410 Unspecified street and highway as the place of occurrence of the external cause: Secondary | ICD-10-CM | POA: Insufficient documentation

## 2020-07-10 DIAGNOSIS — S50312A Abrasion of left elbow, initial encounter: Secondary | ICD-10-CM

## 2020-07-10 DIAGNOSIS — S80212A Abrasion, left knee, initial encounter: Secondary | ICD-10-CM

## 2020-07-10 DIAGNOSIS — T07XXXA Unspecified multiple injuries, initial encounter: Secondary | ICD-10-CM

## 2020-07-10 DIAGNOSIS — Z23 Encounter for immunization: Secondary | ICD-10-CM | POA: Insufficient documentation

## 2020-07-10 DIAGNOSIS — J45909 Unspecified asthma, uncomplicated: Secondary | ICD-10-CM | POA: Insufficient documentation

## 2020-07-10 LAB — GC/CHLAMYDIA PROBE AMP (~~LOC~~) NOT AT ARMC
Chlamydia: POSITIVE — AB
Comment: NEGATIVE
Comment: NORMAL
Neisseria Gonorrhea: POSITIVE — AB

## 2020-07-10 MED ORDER — AZITHROMYCIN 1 G PO PACK
1.0000 g | PACK | Freq: Once | ORAL | Status: AC
  Administered 2020-07-10: 1 g via ORAL
  Filled 2020-07-10: qty 1

## 2020-07-10 MED ORDER — NAPROXEN 250 MG PO TABS
500.0000 mg | ORAL_TABLET | Freq: Once | ORAL | Status: AC
  Administered 2020-07-10: 500 mg via ORAL
  Filled 2020-07-10: qty 2

## 2020-07-10 MED ORDER — LIDOCAINE-EPINEPHRINE 2 %-1:100000 IJ SOLN: 20.0000 mL | Freq: Once | INTRAMUSCULAR | Status: DC

## 2020-07-10 MED ORDER — LIDOCAINE HCL (PF) 1 % IJ SOLN
INTRAMUSCULAR | Status: AC
  Administered 2020-07-10: 2 mL
  Filled 2020-07-10: qty 5

## 2020-07-10 MED ORDER — TETANUS-DIPHTH-ACELL PERTUSSIS 5-2.5-18.5 LF-MCG/0.5 IM SUSP
0.5000 mL | Freq: Once | INTRAMUSCULAR | Status: AC
  Administered 2020-07-10: 0.5 mL via INTRAMUSCULAR
  Filled 2020-07-10: qty 0.5

## 2020-07-10 MED ORDER — OXYCODONE-ACETAMINOPHEN 10-325 MG PO TABS: 1.0000 | ORAL_TABLET | Freq: Four times a day (QID) | ORAL | 0 refills | Status: AC | PRN

## 2020-07-10 MED ORDER — CEPHALEXIN 250 MG PO CAPS: 1000.0000 mg | ORAL_CAPSULE | Freq: Once | ORAL | Status: DC

## 2020-07-10 MED ORDER — CEFTRIAXONE SODIUM 500 MG IJ SOLR
500.0000 mg | Freq: Once | INTRAMUSCULAR | Status: AC
  Administered 2020-07-10: 500 mg via INTRAMUSCULAR
  Filled 2020-07-10: qty 500

## 2020-07-10 MED ORDER — HYDROCODONE-ACETAMINOPHEN 5-325 MG PO TABS
2.0000 | ORAL_TABLET | Freq: Once | ORAL | Status: AC
  Administered 2020-07-10: 2 via ORAL
  Filled 2020-07-10: qty 2

## 2020-07-10 MED ORDER — LIDOCAINE-EPINEPHRINE (PF) 2 %-1:200000 IJ SOLN
INTRAMUSCULAR | Status: AC
  Administered 2020-07-10: 20 mL
  Filled 2020-07-10: qty 20

## 2020-07-10 MED ORDER — CEPHALEXIN 500 MG PO CAPS: 500.0000 mg | ORAL_CAPSULE | Freq: Four times a day (QID) | ORAL | 0 refills | Status: AC

## 2020-07-10 MED ORDER — LIDOCAINE HCL (PF) 1 % IJ SOLN: 2.0000 mL | Freq: Once | INTRAMUSCULAR | Status: AC
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
## 2020-07-10 NOTE — ED Notes (Signed)
Reports urinary discharge and burning, EDP made aware, see new orders.
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
## 2020-11-10 ENCOUNTER — Emergency Department (HOSPITAL_BASED_OUTPATIENT_CLINIC_OR_DEPARTMENT_OTHER)
Admission: EM | Admit: 2020-11-10 | Discharge: 2020-11-11 | Disposition: A | Discharge status: Home / Self Care | Payer: Medicaid Other | Attending: Emergency Medicine | Admitting: Emergency Medicine

## 2020-11-10 ENCOUNTER — Encounter (HOSPITAL_BASED_OUTPATIENT_CLINIC_OR_DEPARTMENT_OTHER): Payer: Self-pay | Admitting: Emergency Medicine

## 2020-11-10 ENCOUNTER — Other Ambulatory Visit: Payer: Self-pay

## 2020-11-10 DIAGNOSIS — R110 Nausea: Secondary | ICD-10-CM | POA: Insufficient documentation

## 2020-11-10 DIAGNOSIS — J45909 Unspecified asthma, uncomplicated: Secondary | ICD-10-CM | POA: Insufficient documentation

## 2020-11-10 DIAGNOSIS — R109 Unspecified abdominal pain: Secondary | ICD-10-CM | POA: Diagnosis not present

## 2020-11-10 LAB — URINALYSIS, ROUTINE W REFLEX MICROSCOPIC
Bilirubin Urine: NEGATIVE
Glucose, UA: NEGATIVE mg/dL
Ketones, ur: NEGATIVE mg/dL
Leukocytes,Ua: NEGATIVE
Nitrite: NEGATIVE
Protein, ur: NEGATIVE mg/dL
Specific Gravity, Urine: 1.015 (ref 1.005–1.030)
pH: 7 (ref 5.0–8.0)

## 2020-11-10 LAB — URINALYSIS, MICROSCOPIC (REFLEX): WBC, UA: NONE SEEN WBC/hpf (ref 0–5)

## 2020-11-10 NOTE — ED Triage Notes (Signed)
Pt reports right flank pain that radiates to groin and nausea; denies dysuria

## 2020-11-11 ENCOUNTER — Emergency Department (HOSPITAL_BASED_OUTPATIENT_CLINIC_OR_DEPARTMENT_OTHER): Payer: Medicaid Other

## 2020-11-11 MED ORDER — KETOROLAC TROMETHAMINE 60 MG/2ML IM SOLN
30.0000 mg | Freq: Once | INTRAMUSCULAR | Status: AC
  Administered 2020-11-11: 30 mg via INTRAMUSCULAR
  Filled 2020-11-11: qty 2

## 2020-11-11 NOTE — ED Notes (Signed)
Patient Returned from CT at this time.  

## 2020-11-11 NOTE — ED Provider Notes (Signed)
[facility] EMERGENCY DEPARTMENT Provider Note  CSN: [id number] Arrival date & time: 11/10/20 2140  Chief Complaint(s) Flank Pain  HPI Tracy Mora is a 20 y.o. male here with sudden onset of right flank pain that began at 4 PM this afternoon. Constant in nature but fluctuating in intensity. Cramping/aching. Radiated to periumbilical region. No alleviating or aggravating factors. No dysuria or hematuria.  No penile discharge.  No testicular pain. Reports nausea without emesis.  HPI  Past Medical History Past Medical History:  Diagnosis Date  . Asthma   . History of seasonal allergies    There are no problems to display for this patient.  Home Medication(s) Prior to Admission medications   Medication Sig Start Date End Date Taking? Authorizing Provider  cephALEXin (KEFLEX) 500 MG capsule Take 1 capsule (500 mg total) by mouth 4 (four) times daily. 07/10/20   Molpus, John, MD  esomeprazole (NEXIUM) 40 MG capsule Take 1 capsule (40 mg total) by mouth daily. 12/20/19   Molpus, John, MD  meloxicam (MOBIC) 15 MG tablet Take 1 tablet daily as needed for pain. 12/20/19   Molpus, John, MD  oxyCODONE-acetaminophen (PERCOCET) 10-325 MG tablet Take 1 tablet by mouth every 6 (six) hours as needed for pain. 07/10/20   Molpus, John, MD                                                                                                                                    Past Surgical History History reviewed. No pertinent surgical history. Family History No family history on file.  Social History Social History   Tobacco Use  . Smoking status: Never Smoker  . Smokeless tobacco: Never Used  Vaping Use  . Vaping Use: Every day  Substance Use Topics  . Alcohol use: Yes    Comment: occ  . Drug use: Yes    Types: Marijuana   Allergies Patient has no known allergies.  Review of Systems Review of Systems All other systems are reviewed and are negative for acute change except as  noted in the HPI  Physical Exam Vital Signs  I have reviewed the triage vital signs BP 140/61   Pulse 72   Temp 97.7 F (36.5 C) (Oral)   Resp 18   Ht 5\' 11"  (1.803 m)   Wt 77.1 kg   SpO2 98%   BMI 23.71 kg/m   Physical Exam Vitals reviewed.  Constitutional:      General: He is not in acute distress.    Appearance: He is well-developed and well-nourished. He is not diaphoretic.  HENT:     Head: Normocephalic and atraumatic.     Jaw: No trismus.     Right Ear: External ear normal.     Left Ear: External ear normal.     Nose: Nose normal.     Mouth/Throat:     Mouth: Mucous membranes are normal.  Eyes:  General: No scleral icterus.    Extraocular Movements: EOM normal.     Conjunctiva/sclera: Conjunctivae normal.  Neck:     Trachea: Phonation normal.  Cardiovascular:     Rate and Rhythm: Normal rate and regular rhythm.  Pulmonary:     Effort: Pulmonary effort is normal. No respiratory distress.     Breath sounds: No stridor.  Abdominal:     General: There is no distension.     Tenderness: There is abdominal tenderness (mild). There is right CVA tenderness. There is no guarding or rebound. Negative signs include McBurney's sign.  Musculoskeletal:        General: No edema. Normal range of motion.     Cervical back: Normal range of motion.  Neurological:     Mental Status: He is alert and oriented to person, place, and time.  Psychiatric:        Mood and Affect: Mood and affect normal.        Behavior: Behavior normal.     ED Results and Treatments Labs (all labs ordered are listed, but only abnormal results are displayed) Labs Reviewed  URINALYSIS, ROUTINE W REFLEX MICROSCOPIC - Abnormal; Notable for the following components:      Result Value   Hgb urine dipstick SMALL (*)    All other components within normal limits  URINALYSIS, MICROSCOPIC (REFLEX) - Abnormal; Notable for the following components:   Bacteria, UA RARE (*)    All other components within  normal limits                                                                                                                         EKG  EKG Interpretation  Date/Time:    Ventricular Rate:    PR Interval:    QRS Duration:   QT Interval:    QTC Calculation:   R Axis:     Text Interpretation:        Radiology CT Renal Stone Study  Result Date: 11/11/2020 CLINICAL DATA:  Right flank pain radiating to the groin EXAM: CT ABDOMEN AND PELVIS WITHOUT CONTRAST TECHNIQUE: Multidetector CT imaging of the abdomen and pelvis was performed following the standard protocol without IV contrast. COMPARISON:  None. FINDINGS: LOWER CHEST: Normal. HEPATOBILIARY: Normal hepatic contours. No intra- or extrahepatic biliary dilatation. Normal gallbladder. PANCREAS: Normal pancreas. No ductal dilatation or peripancreatic fluid collection. SPLEEN: Normal. ADRENALS/URINARY TRACT: The adrenal glands are normal. No hydronephrosis, nephroureterolithiasis or solid renal mass. The urinary bladder is normal for degree of distention STOMACH/BOWEL: There is no hiatal hernia. Normal duodenal course and caliber. No small bowel dilatation or inflammation. No focal colonic abnormality. Normal appendix. VASCULAR/LYMPHATIC: Normal course and caliber of the major abdominal vessels. No abdominal or pelvic lymphadenopathy. REPRODUCTIVE: Normal prostate size with symmetric seminal vesicles. MUSCULOSKELETAL. No bony spinal canal stenosis or focal osseous abnormality. OTHER: None. IMPRESSION: No acute abnormality of the abdomen or pelvis. Electronically Signed   By: Deatra Robinson M.D.   On: 11/11/2020  02:12    Pertinent labs & imaging results that were available during my care of the patient were reviewed by me and considered in my medical decision making (see chart for details).  Medications Ordered in ED Medications  ketorolac (TORADOL) injection 30 mg (30 mg Intramuscular Given 11/11/20 0144)                                                                                                                                     Procedures Procedures  (including critical care time)  Medical Decision Making / ED Course I have reviewed the nursing notes for this encounter and the patient's prior records (if available in EHR or on provided paperwork).   Tayvion Jeneen Rinks was evaluated in Emergency Department on 11/11/2020 for the symptoms described in the history of present illness. He was evaluated in the context of the global COVID-19 pandemic, which necessitated consideration that the patient might be at risk for infection with the SARS-CoV-2 virus that causes COVID-19. Institutional protocols and algorithms that pertain to the evaluation of patients at risk for COVID-19 are in a state of rapid change based on information released by regulatory bodies including the CDC and federal and state organizations. These policies and algorithms were followed during the patient's care in the ED.  Right flank pain. Possible pyelonephritis, renal colic, intestinal colic, muscle strain. Low suspicion for appendicitis. UA without evidence of infection.  Positive for small hemoglobin and RBCs. CT stone study negative. Provided with Toradol. Reported improved pain.      Final Clinical Impression(s) / ED Diagnoses Final diagnoses:  Right flank pain    The patient appears reasonably screened and/or stabilized for discharge and I doubt any other medical condition or other [facility] requiring further screening, evaluation, or treatment in the ED at this time prior to discharge. Safe for discharge with strict return precautions.  Disposition: Discharge  Condition: Good  I have discussed the results, Dx and Tx plan with the patient/family who expressed understanding and agree(s) with the plan. Discharge instructions discussed at length. The patient/family was given strict return precautions who verbalized understanding of the instructions. No further  questions at time of discharge.    ED Discharge Orders    None      Follow Up: Anders Simmonds, PA-C [address] [place] [address] [phone]  Call  as needed     This chart was dictated using voice recognition software.  Despite best efforts to proofread,  errors can occur which can change the documentation meaning.   Nira Conn, MD 11/11/20 [phone]

## 2020-11-11 NOTE — ED Notes (Signed)
EDP at bedside  

## 2020-11-11 NOTE — ED Notes (Signed)
Patient transported to CT at this time. 

## 2020-12-16 NOTE — Progress Notes (Deleted)
Patient ID: Tracy Mora, male   DOB: 03/30/2001, 20 y.o.   MRN: [id number]   After ED visit 11/10/2020 for flank pain.    From ED note: Right flank pain. Possible pyelonephritis, renal colic, intestinal colic, muscle strain. Low suspicion for appendicitis. UA without evidence of infection.  Positive for small hemoglobin and RBCs. CT stone study negative. Provided with Toradol. Reported improved pain.

## 2020-12-17 ENCOUNTER — Inpatient Hospital Stay: Payer: Medicaid Other | Admitting: Physician Assistant

## 2020-12-17 DIAGNOSIS — Z8619 Personal history of other infectious and parasitic diseases: Secondary | ICD-10-CM

## 2021-08-22 ENCOUNTER — Encounter (HOSPITAL_BASED_OUTPATIENT_CLINIC_OR_DEPARTMENT_OTHER): Payer: Self-pay | Admitting: Emergency Medicine

## 2021-08-22 ENCOUNTER — Other Ambulatory Visit: Payer: Self-pay

## 2021-08-22 ENCOUNTER — Emergency Department (HOSPITAL_BASED_OUTPATIENT_CLINIC_OR_DEPARTMENT_OTHER)
Admission: EM | Admit: 2021-08-22 | Discharge: 2021-08-22 | Disposition: A | Discharge status: Home / Self Care | Payer: Medicaid Other | Attending: Emergency Medicine | Admitting: Emergency Medicine

## 2021-08-22 ENCOUNTER — Emergency Department (HOSPITAL_BASED_OUTPATIENT_CLINIC_OR_DEPARTMENT_OTHER): Payer: Medicaid Other

## 2021-08-22 DIAGNOSIS — R197 Diarrhea, unspecified: Secondary | ICD-10-CM | POA: Diagnosis not present

## 2021-08-22 DIAGNOSIS — R1032 Left lower quadrant pain: Secondary | ICD-10-CM | POA: Insufficient documentation

## 2021-08-22 DIAGNOSIS — J45909 Unspecified asthma, uncomplicated: Secondary | ICD-10-CM | POA: Insufficient documentation

## 2021-08-22 LAB — CBC WITH DIFFERENTIAL/PLATELET
Abs Immature Granulocytes: 0.01 10*3/uL (ref 0.00–0.07)
Basophils Absolute: 0.1 10*3/uL (ref 0.0–0.1)
Basophils Relative: 1 %
Eosinophils Absolute: 0.6 10*3/uL — ABNORMAL HIGH (ref 0.0–0.5)
Eosinophils Relative: 7 %
HCT: 40.9 % (ref 39.0–52.0)
Hemoglobin: 12.5 g/dL — ABNORMAL LOW (ref 13.0–17.0)
Immature Granulocytes: 0 %
Lymphocytes Relative: 43 %
Lymphs Abs: 3.2 10*3/uL (ref 0.7–4.0)
MCH: 26.1 pg (ref 26.0–34.0)
MCHC: 30.6 g/dL (ref 30.0–36.0)
MCV: 85.4 fL (ref 80.0–100.0)
Monocytes Absolute: 1.1 10*3/uL — ABNORMAL HIGH (ref 0.1–1.0)
Monocytes Relative: 14 %
Neutro Abs: 2.6 10*3/uL (ref 1.7–7.7)
Neutrophils Relative %: 35 %
Platelets: 251 10*3/uL (ref 150–400)
RBC: 4.79 MIL/uL (ref 4.22–5.81)
RDW: 13.9 % (ref 11.5–15.5)
WBC: 7.4 10*3/uL (ref 4.0–10.5)
nRBC: 0 % (ref 0.0–0.2)

## 2021-08-22 LAB — COMPREHENSIVE METABOLIC PANEL
ALT: 17 U/L (ref 0–44)
AST: 19 U/L (ref 15–41)
Albumin: 4.1 g/dL (ref 3.5–5.0)
Alkaline Phosphatase: 38 U/L (ref 38–126)
Anion gap: 8 (ref 5–15)
BUN: 16 mg/dL (ref 6–20)
CO2: 24 mmol/L (ref 22–32)
Calcium: 9 mg/dL (ref 8.9–10.3)
Chloride: 105 mmol/L (ref 98–111)
Creatinine, Ser: 0.94 mg/dL (ref 0.61–1.24)
GFR, Estimated: >60 mL/min (ref >60)
Glucose, Bld: 118 mg/dL — ABNORMAL HIGH (ref 70–99)
Potassium: 3.6 mmol/L (ref 3.5–5.1)
Sodium: 137 mmol/L (ref 135–145)
Total Bilirubin: 0.5 mg/dL (ref 0.3–1.2)
Total Protein: 7.6 g/dL (ref 6.5–8.1)

## 2021-08-22 LAB — URINALYSIS, ROUTINE W REFLEX MICROSCOPIC
Bilirubin Urine: NEGATIVE
Glucose, UA: NEGATIVE mg/dL
Hgb urine dipstick: NEGATIVE
Ketones, ur: NEGATIVE mg/dL
Leukocytes,Ua: NEGATIVE
Nitrite: NEGATIVE
Protein, ur: NEGATIVE mg/dL
Specific Gravity, Urine: >=1.03 (ref 1.005–1.030)
pH: 5.5 (ref 5.0–8.0)

## 2021-08-22 LAB — LIPASE, BLOOD: Lipase: 30 U/L (ref 11–51)

## 2021-08-22 MED ORDER — KETOROLAC TROMETHAMINE 30 MG/ML IJ SOLN
30.0000 mg | Freq: Once | INTRAMUSCULAR | Status: AC
  Administered 2021-08-22: 03:00:00 30 mg via INTRAVENOUS
  Filled 2021-08-22: qty 1

## 2021-08-22 MED ORDER — ONDANSETRON HCL 4 MG/2ML IJ SOLN
4.0000 mg | Freq: Once | INTRAMUSCULAR | Status: AC
  Administered 2021-08-22: 03:00:00 4 mg via INTRAVENOUS
  Filled 2021-08-22: qty 2

## 2021-08-22 MED ORDER — IOHEXOL 300 MG/ML  SOLN
100.0000 mL | Freq: Once | INTRAMUSCULAR | Status: AC | PRN
  Administered 2021-08-22: 03:00:00 100 mL via INTRAVENOUS

## 2021-08-22 MED ORDER — SODIUM CHLORIDE 0.9 % IV BOLUS
1000.0000 mL | Freq: Once | INTRAVENOUS | Status: AC
  Administered 2021-08-22: 03:00:00 1000 mL via INTRAVENOUS

## 2021-08-22 NOTE — ED Triage Notes (Addendum)
LLQ abdominal pain x 2 days. Also endorses distension, fever, diarrhea, and nausea.

## 2021-08-22 NOTE — ED Provider Notes (Signed)
[facility] EMERGENCY DEPARTMENT Provider Note   CSN: [id number] Arrival date & time: 08/22/21  0132     History Chief Complaint  Patient presents with   Abdominal Pain   Diarrhea   Nausea    Lekendrick LANNIE YUSUF is a 20 y.o. male.  Patient is a 20 year old male with past medical history of asthma.  He presents today with complaints of abdominal pain, diarrhea and nausea.  This has been worsening over the past 3 days.  He reports a bloating and cramping to the left lower quadrant with associated diarrhea and loose stools.  He denies any fevers or chills.  He denies any bloody stools.  He denies urinary complaints.  There are no aggravating or alleviating factors.  The history is provided by the patient.  Abdominal Pain Pain location:  LLQ Pain quality: cramping   Pain radiates to:  Does not radiate Pain severity:  Moderate Onset quality:  Gradual Duration:  3 days Timing:  Constant Progression:  Worsening Chronicity:  New Relieved by:  Nothing Worsened by:  Nothing Associated symptoms: diarrhea   Diarrhea Associated symptoms: abdominal pain       Past Medical History:  Diagnosis Date   Asthma    History of seasonal allergies     There are no problems to display for this patient.   History reviewed. No pertinent surgical history.     History reviewed. No pertinent family history.  Social History   Tobacco Use   Smoking status: Never   Smokeless tobacco: Never  Vaping Use   Vaping Use: Every day  Substance Use Topics   Alcohol use: Yes    Comment: occ   Drug use: Yes    Types: Marijuana    Home Medications Prior to Admission medications   Medication Sig Start Date End Date Taking? Authorizing Provider  cephALEXin (KEFLEX) 500 MG capsule Take 1 capsule (500 mg total) by mouth 4 (four) times daily. 07/10/20   Molpus, John, MD  esomeprazole (NEXIUM) 40 MG capsule Take 1 capsule (40 mg total) by mouth daily. 12/20/19   Molpus, John, MD  meloxicam  (MOBIC) 15 MG tablet Take 1 tablet daily as needed for pain. 12/20/19   Molpus, John, MD  oxyCODONE-acetaminophen (PERCOCET) 10-325 MG tablet Take 1 tablet by mouth every 6 (six) hours as needed for pain. 07/10/20   Molpus, Jonny Ruiz, MD    Allergies    Patient has no known allergies.  Review of Systems   Review of Systems  Gastrointestinal:  Positive for abdominal pain and diarrhea.  All other systems reviewed and are negative.  Physical Exam Updated Vital Signs BP 130/64   Pulse 77   Temp 97.8 F (36.6 C)   Resp 16   Ht 5\' 11"  (1.803 m)   Wt 95.3 kg   SpO2 98%   BMI 29.29 kg/m   Physical Exam Vitals and nursing note reviewed.  Constitutional:      General: He is not in acute distress.    Appearance: He is well-developed. He is not diaphoretic.  HENT:     Head: Normocephalic and atraumatic.  Cardiovascular:     Rate and Rhythm: Normal rate and regular rhythm.     Heart sounds: No murmur heard.   No friction rub.  Pulmonary:     Effort: Pulmonary effort is normal. No respiratory distress.     Breath sounds: Normal breath sounds. No wheezing or rales.  Abdominal:     General: Bowel sounds are normal.  There is no distension.     Palpations: Abdomen is soft.     Tenderness: There is abdominal tenderness in the left lower quadrant. There is no right CVA tenderness, left CVA tenderness, guarding or rebound.  Musculoskeletal:        General: Normal range of motion.     Cervical back: Normal range of motion and neck supple.  Skin:    General: Skin is warm and dry.  Neurological:     Mental Status: He is alert and oriented to person, place, and time.     Coordination: Coordination normal.    ED Results / Procedures / Treatments   Labs (all labs ordered are listed, but only abnormal results are displayed) Labs Reviewed  CBC WITH DIFFERENTIAL/PLATELET - Abnormal; Notable for the following components:      Result Value   Hemoglobin 12.5 (*)    Monocytes Absolute 1.1 (*)     Eosinophils Absolute 0.6 (*)    All other components within normal limits  LIPASE, BLOOD  COMPREHENSIVE METABOLIC PANEL  URINALYSIS, ROUTINE W REFLEX MICROSCOPIC    EKG EKG Interpretation  Date/Time:  Sunday August 22 2021 01:42:33 EST Ventricular Rate:  83 PR Interval:  144 QRS Duration: 79 QT Interval:  351 QTC Calculation: 413 R Axis:   61 Text Interpretation: Sinus rhythm ST elev, probable normal early repol pattern Confirmed by Geoffery Lyons ([id number]) on 08/22/2021 2:31:10 AM  Radiology No results found.  Procedures Procedures   Medications Ordered in ED Medications - No data to display  ED Course  I have reviewed the triage vital signs and the nursing notes.  Pertinent labs & imaging results that were available during my care of the patient were reviewed by me and considered in my medical decision making (see chart for details).    MDM Rules/Calculators/A&P  Patient presenting here with complaints of left lower quadrant abdominal pain and diarrhea for the past 2 days.  He reports loose stools.  Swelling to the left lower abdomen.  Patient's work-up is unremarkable including laboratory studies and CT scan.  Urinalysis is clear.  Patient seems appropriate for discharge.  I suspect a viral etiology.  Patient to be discharged with NSAIDs and as needed return.  Final Clinical Impression(s) / ED Diagnoses Final diagnoses:  None    Rx / DC Orders ED Discharge Orders     None        Geoffery Lyons, MD 08/22/21 0400

## 2021-08-22 NOTE — Discharge Instructions (Signed)
Take ibuprofen 600 mg every 6 hours as needed for pain. ? ?Follow-up with primary doctor if not improving in the next few days, and return to the ER if symptoms significantly worsen or change. ?

## 2021-08-22 NOTE — ED Notes (Signed)
Discharge instructions discussed with pt. Pt verbalized understanding with no questions at this time. Pt to go home with s/o at bedside 

## 2021-10-27 IMAGING — CT CT RENAL STONE PROTOCOL
2 of 4 series · 17 of 46 positions shown, 19 images · non-contrast
Comparison: None.

CLINICAL DATA: Right flank pain radiating to the groin

EXAM:
CT ABDOMEN AND PELVIS WITHOUT CONTRAST
TECHNIQUE: Multidetector CT imaging of the abdomen and pelvis was performed
following the standard protocol without IV contrast.

[Series 2: axial st · axial · 0.95mm/px · z∈[+651,+1091]mm · 14 of 96 slices shown, 16 images]
[im 4/96  soft-tissue]
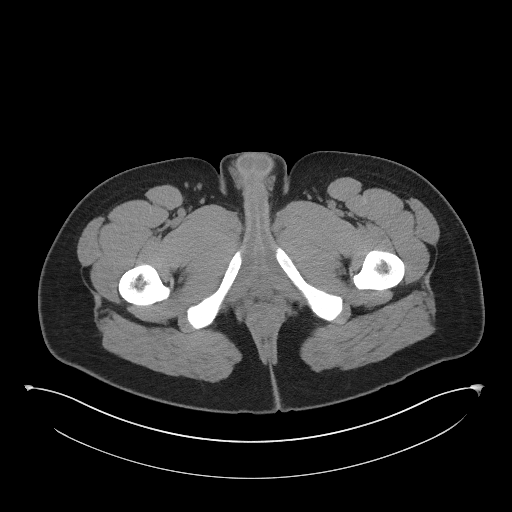
[im 4/96  bone]
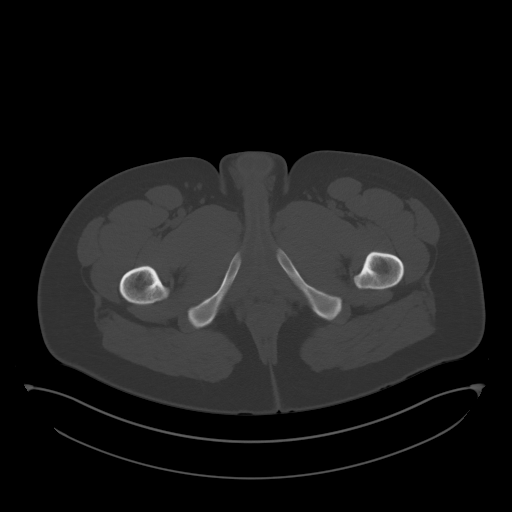
[im 11/96  soft-tissue]
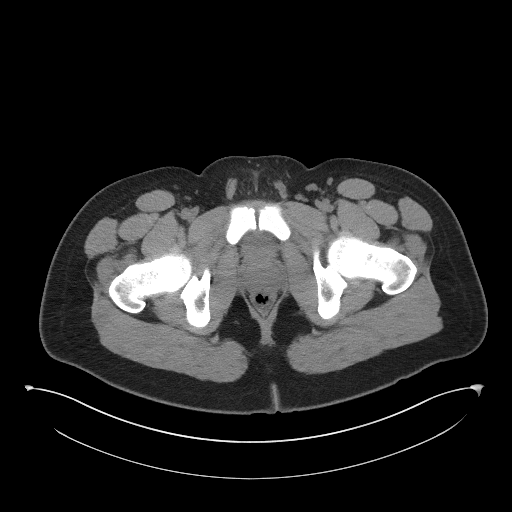
[im 19/96  soft-tissue]
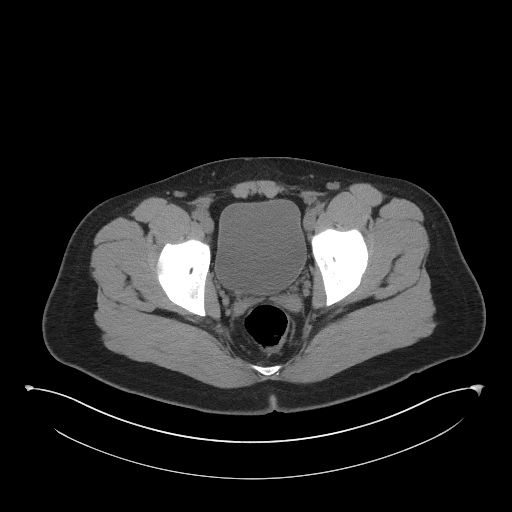
[im 26/96  soft-tissue]
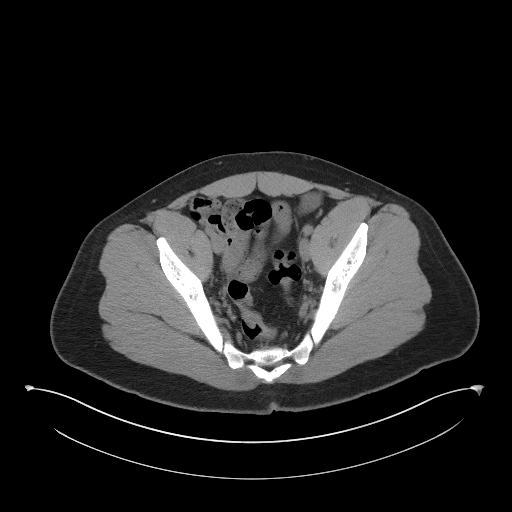
[im 33/96  soft-tissue]
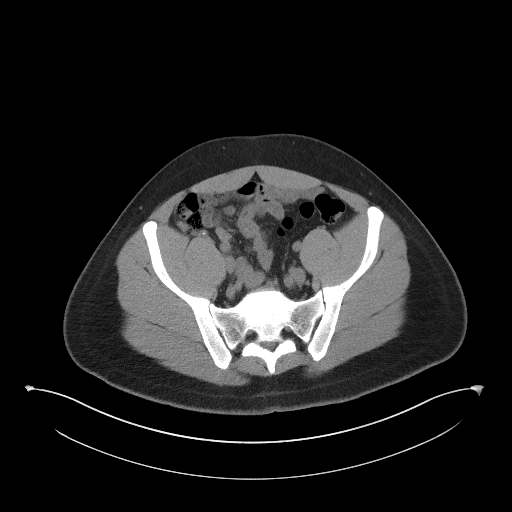
[im 37/96  soft-tissue]
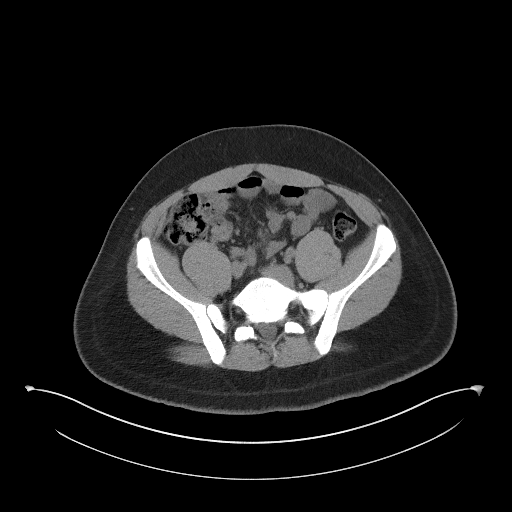
[im 44/96  soft-tissue]
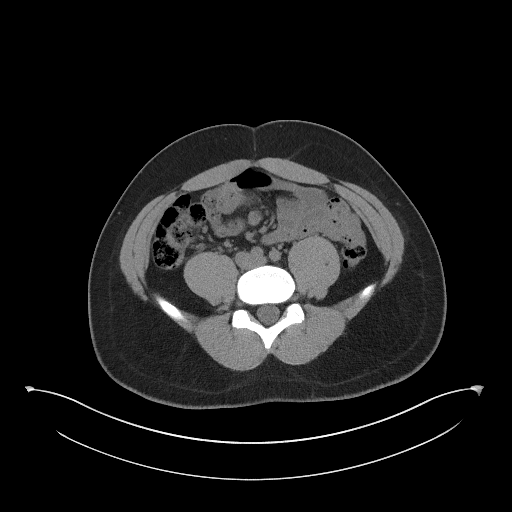
[im 52/96  soft-tissue]
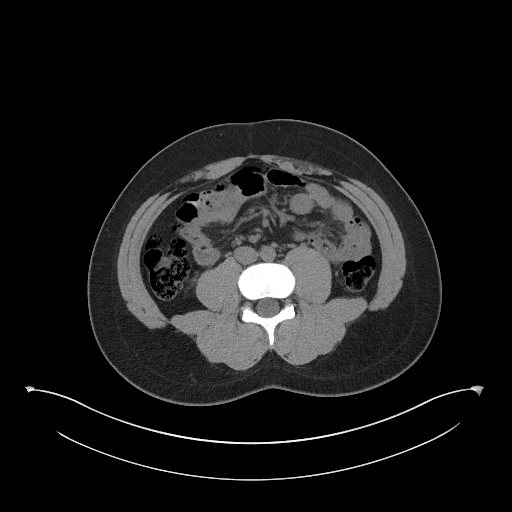
[im 59/96  soft-tissue]
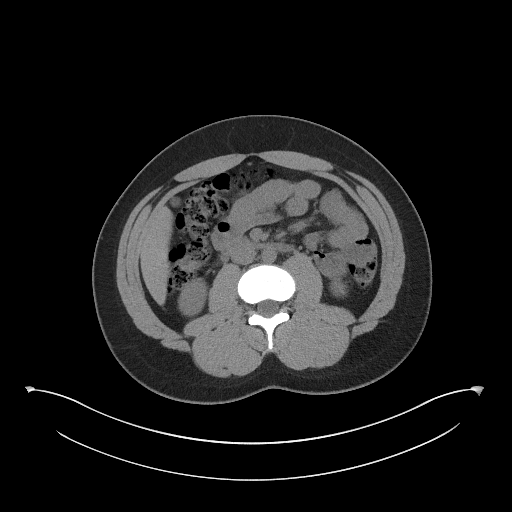
[im 59/96  bone]
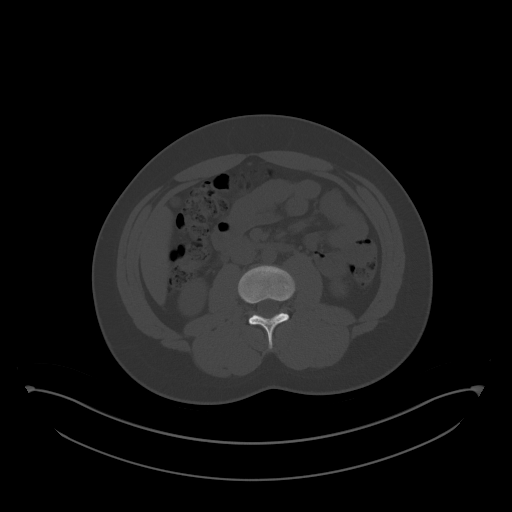
[im 63/96  soft-tissue]
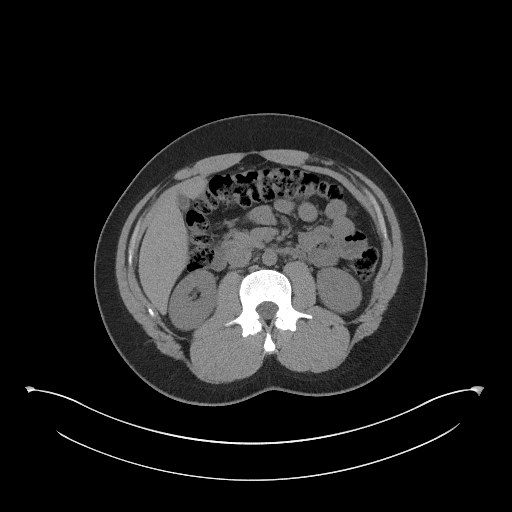
[im 70/96  soft-tissue]
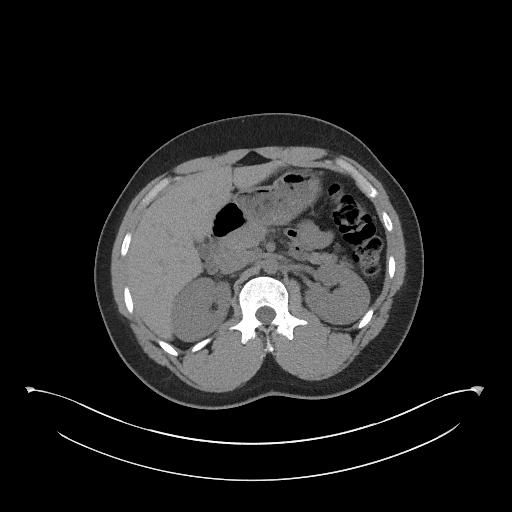
[im 77/96  soft-tissue]
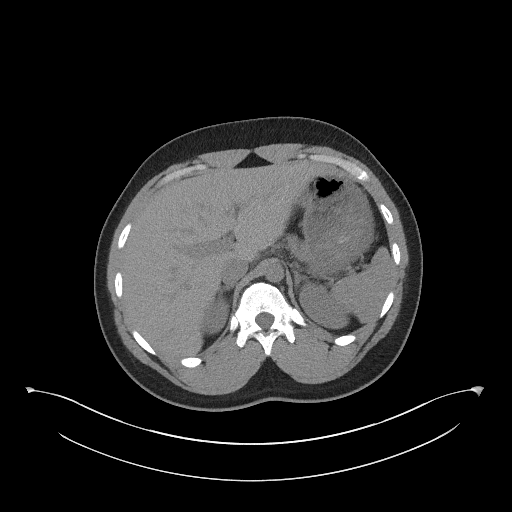
[im 85/96  soft-tissue]
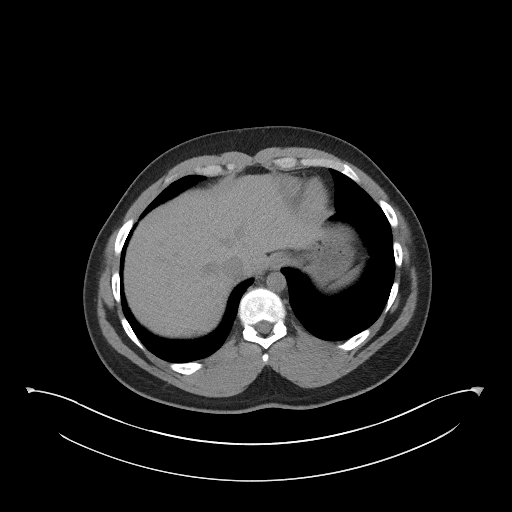
[im 92/96  soft-tissue]
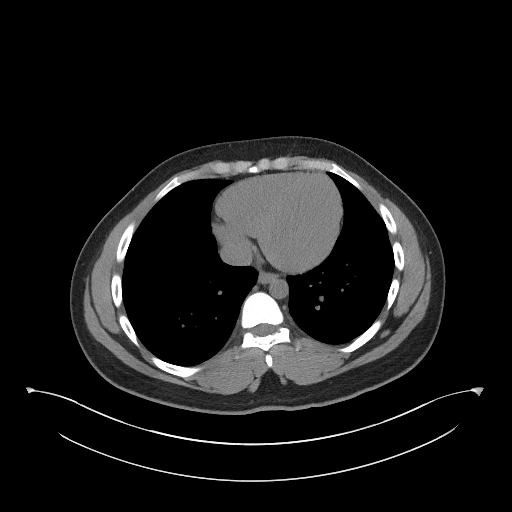

[Series 5: coronal st · coronal · 0.83mm/px · 3 of 93 slices shown]
[im 31/93  soft-tissue]
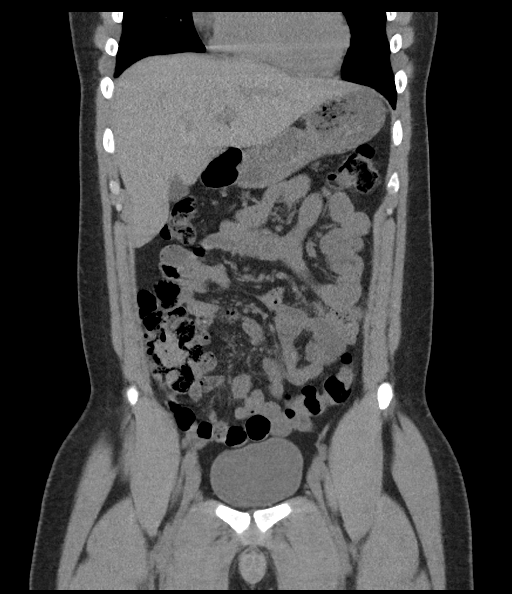
[im 41/93  soft-tissue]
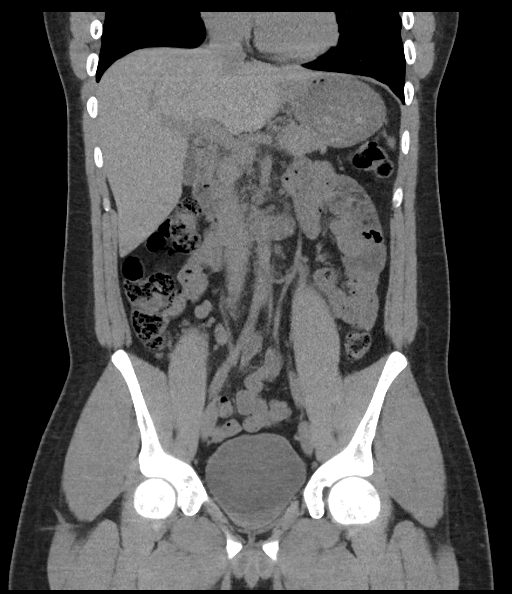
[im 52/93  soft-tissue]
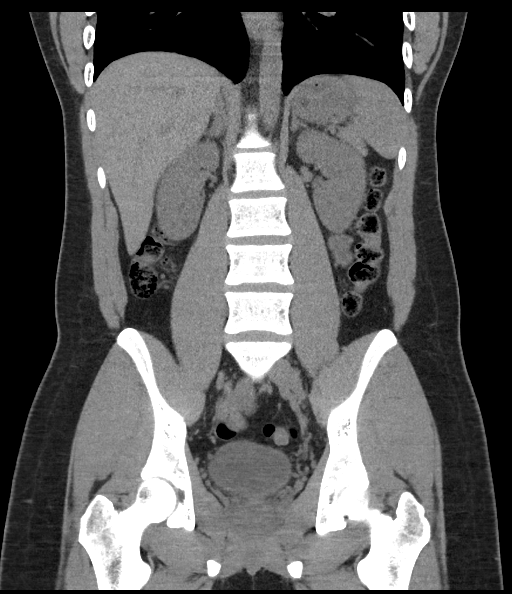

[17 of 46 positions shown; findings below may reference images not displayed]

FINDINGS: LOWER CHEST: Normal.

HEPATOBILIARY: Normal hepatic contours. No intra- or extrahepatic
biliary dilatation. Normal gallbladder.

PANCREAS: Normal pancreas. No ductal dilatation or peripancreatic
fluid collection.

SPLEEN: Normal.

ADRENALS/URINARY TRACT: The adrenal glands are normal. No
hydronephrosis, nephroureterolithiasis or solid renal mass. The
urinary bladder is normal for degree of distention

STOMACH/BOWEL: There is no hiatal hernia. Normal duodenal course and
caliber. No small bowel dilatation or inflammation. No focal colonic
abnormality. Normal appendix.

VASCULAR/LYMPHATIC: Normal course and caliber of the major abdominal
vessels. No abdominal or pelvic lymphadenopathy.

REPRODUCTIVE: Normal prostate size with symmetric seminal vesicles.

MUSCULOSKELETAL. No bony spinal canal stenosis or focal osseous
abnormality.

OTHER: None.
IMPRESSION: No acute abnormality of the abdomen or pelvis.

## 2022-11-08 ENCOUNTER — Encounter: Payer: Self-pay | Admitting: Emergency Medicine

## 2022-11-08 ENCOUNTER — Other Ambulatory Visit: Payer: Self-pay

## 2022-11-08 ENCOUNTER — Ambulatory Visit
Admission: EM | Admit: 2022-11-08 | Discharge: 2022-11-08 | Disposition: A | Discharge status: Home / Self Care | Payer: BC Managed Care – PPO | Attending: Physician Assistant | Admitting: Physician Assistant

## 2022-11-08 DIAGNOSIS — Z1152 Encounter for screening for COVID-19: Secondary | ICD-10-CM | POA: Diagnosis present

## 2022-11-08 DIAGNOSIS — J069 Acute upper respiratory infection, unspecified: Secondary | ICD-10-CM | POA: Diagnosis present

## 2022-11-08 DIAGNOSIS — R112 Nausea with vomiting, unspecified: Secondary | ICD-10-CM | POA: Insufficient documentation

## 2022-11-08 DIAGNOSIS — R509 Fever, unspecified: Secondary | ICD-10-CM | POA: Insufficient documentation

## 2022-11-08 MED ORDER — PROMETHAZINE-DM 6.25-15 MG/5ML PO SYRP: 5.0000 mL | ORAL_SOLUTION | Freq: Four times a day (QID) | ORAL | 0 refills | Status: AC | PRN

## 2022-11-08 MED ORDER — OSELTAMIVIR PHOSPHATE 75 MG PO CAPS: 75.0000 mg | ORAL_CAPSULE | Freq: Two times a day (BID) | ORAL | 0 refills | Status: AC

## 2022-11-08 NOTE — Discharge Instructions (Addendum)
Advised take ibuprofen or Tylenol as needed for fever, chills, body aches. Advised take Phenergan DM, 1 teaspoon every 6 hours as needed for cough and congestion. Advised take Tamiflu 75 mg every 12 hours to treat the flu.  COVID test will be completed in 48 hours.  If you do not get a call from this office within 48 hours that indicates the test is negative.  Log onto MyChart to be the test results with the post in 48 hours.  Advised follow-up PCP or return to urgent care as needed.

## 2022-11-08 NOTE — ED Provider Notes (Signed)
[facility]    CSN: [id number] Arrival date & time: 11/08/22  1554      History   Chief Complaint Chief Complaint  Patient presents with   Generalized Body Aches   Diarrhea    HPI Tracy Mora is a 22 y.o. male.   22 year old male presents with fever, congestion, nausea.  Patient indicates that a fellow employee tested positive for COVID last week.  Patient indicates over the past 48 hours he has had mild cough, congestion with rhinitis mainly being clear.  He indicates he has had mild chest congestion with intermittent cough, mainly clear production.  He also indicates having mild sore throat and painful swallowing which is intermittent.  He indicates yesterday he was having fever 101-102, chills, sweats, body aches and pain, muscle soreness, and sweats.  He indicates "I do not".  He is tolerating fluids well although he does indicate having some intermittent diarrhea.  He is without shortness of breath or wheezing.   Diarrhea   Past Medical History:  Diagnosis Date   Asthma    History of seasonal allergies     There are no problems to display for this patient.   History reviewed. No pertinent surgical history.     Home Medications    Prior to Admission medications   Medication Sig Start Date End Date Taking? Authorizing Provider  oseltamivir (TAMIFLU) 75 MG capsule Take 1 capsule (75 mg total) by mouth every 12 (twelve) hours. 11/08/22  Yes Nyoka Lint, PA-C  promethazine-dextromethorphan (PROMETHAZINE-DM) 6.25-15 MG/5ML syrup Take 5 mLs by mouth 4 (four) times daily as needed for cough. 11/08/22  Yes Nyoka Lint, PA-C  cephALEXin (KEFLEX) 500 MG capsule Take 1 capsule (500 mg total) by mouth 4 (four) times daily. Patient not taking: Reported on 11/08/2022 07/10/20   Molpus, John, MD  esomeprazole (NEXIUM) 40 MG capsule Take 1 capsule (40 mg total) by mouth daily. 12/20/19   Molpus, John, MD  meloxicam (MOBIC) 15 MG tablet Take 1 tablet daily as needed  for pain. 12/20/19   Molpus, John, MD  oxyCODONE-acetaminophen (PERCOCET) 10-325 MG tablet Take 1 tablet by mouth every 6 (six) hours as needed for pain. Patient not taking: Reported on 11/08/2022 07/10/20   Molpus, Jenny Reichmann, MD    Family History History reviewed. No pertinent family history.  Social History Social History   Tobacco Use   Smoking status: Never   Smokeless tobacco: Never  Vaping Use   Vaping Use: Every day  Substance Use Topics   Alcohol use: Yes    Comment: occ   Drug use: Yes    Types: Marijuana     Allergies   Patient has no known allergies.   Review of Systems Review of Systems  HENT:  Positive for postnasal drip and rhinorrhea.   Respiratory:  Positive for cough.   Gastrointestinal:  Positive for diarrhea.     Physical Exam Triage Vital Signs ED Triage Vitals [11/08/22 1610]  Enc Vitals Group     BP (!) 140/83     Pulse Rate 98     Resp 18     Temp 98.3 F (36.8 C)     Temp Source Oral     SpO2 98 %     Weight      Height      Head Circumference      Peak Flow      Pain Score 4     Pain Loc      Pain Edu?  Excl. in [place]?    No data found.  Updated Vital Signs BP (!) 140/83 (BP Location: Left Arm)   Pulse 98   Temp 98.3 F (36.8 C) (Oral)   Resp 18   SpO2 98%   Visual Acuity Right Eye Distance:   Left Eye Distance:   Bilateral Distance:    Right Eye Near:   Left Eye Near:    Bilateral Near:     Physical Exam Constitutional:      Appearance: Normal appearance.  HENT:     Right Ear: Tympanic membrane and ear canal normal.     Left Ear: Tympanic membrane and ear canal normal.     Mouth/Throat:     Mouth: Mucous membranes are moist.     Pharynx: Oropharynx is clear. No oropharyngeal exudate or posterior oropharyngeal erythema.  Cardiovascular:     Rate and Rhythm: Normal rate and regular rhythm.     Heart sounds: Normal heart sounds.  Pulmonary:     Effort: Pulmonary effort is normal.     Breath sounds: Normal breath  sounds and air entry. No wheezing or rhonchi.  Abdominal:     General: Abdomen is flat. Bowel sounds are normal.     Palpations: Abdomen is soft.     Tenderness: There is generalized abdominal tenderness. There is no guarding or rebound.  Lymphadenopathy:     Cervical: No cervical adenopathy.  Neurological:     Mental Status: He is alert.      UC Treatments / Results  Labs (all labs ordered are listed, but only abnormal results are displayed) Labs Reviewed  SARS CORONAVIRUS 2 (TAT 6-24 HRS)  POCT INFLUENZA A/B    EKG   Radiology No results found.  Procedures Procedures (including critical care time)  Medications Ordered in UC Medications - No data to display  Initial Impression / Assessment and Plan / UC Course  I have reviewed the triage vital signs and the nursing notes.  Pertinent labs & imaging results that were available during my care of the patient were reviewed by me and considered in my medical decision making (see chart for details).    Plan: The diagnosis will be treated with the following: 1.  Fever: A.  Ibuprofen or Tylenol to help control fever. 2.  Nausea and vomiting: A.  Phenergan DM, 1 teaspoon every 6 hours for congestion, cough, nausea. 3.  Acute respiratory infection: A.  Phenergan DM, 1 teaspoon every 6 hours for cough, congestion and nausea. 4.  Screening for COVID-19: A.  Treatment may be modified depending on the results of the COVID test. 5.  Advised follow-up PCP return to urgent care as needed. Final Clinical Impressions(s) / UC Diagnoses   Final diagnoses:  Fever, unspecified  Nausea and vomiting, unspecified vomiting type  Acute upper respiratory infection  Encounter for screening for COVID-19     Discharge Instructions      Advised take ibuprofen or Tylenol as needed for fever, chills, body aches. Advised take Phenergan DM, 1 teaspoon every 6 hours as needed for cough and congestion. Advised take Tamiflu 75 mg every 12  hours to treat the flu.  COVID test will be completed in 48 hours.  If you do not get a call from this office within 48 hours that indicates the test is negative.  Log onto MyChart to be the test results with the post in 48 hours.  Advised follow-up PCP or return to urgent care as needed.     ED  Prescriptions     Medication Sig Dispense Auth. Provider   promethazine-dextromethorphan (PROMETHAZINE-DM) 6.25-15 MG/5ML syrup Take 5 mLs by mouth 4 (four) times daily as needed for cough. 118 mL Nyoka Lint, PA-C   oseltamivir (TAMIFLU) 75 MG capsule Take 1 capsule (75 mg total) by mouth every 12 (twelve) hours. 10 capsule Nyoka Lint, PA-C      PDMP not reviewed this encounter.   Nyoka Lint, PA-C 11/08/22 1643

## 2022-11-08 NOTE — ED Triage Notes (Signed)
Pt sts fever, body aches, diarrhea and nasal congestion x 2 days

## 2022-11-09 LAB — SARS CORONAVIRUS 2 (TAT 6-24 HRS): SARS Coronavirus 2: NEGATIVE

## 2023-08-20 DEATH — deceased
